# Patient Record
Sex: Male | Born: 1960 | Race: White | Hispanic: No | Marital: Married | Smoking: Former smoker
Health system: Southern US, Community
[De-identification: ages and names within clinical notes are randomized; demographics above are authoritative.]

## PROBLEM LIST (undated history)

## (undated) DIAGNOSIS — E7800 Pure hypercholesterolemia, unspecified: Secondary | ICD-10-CM

## (undated) DIAGNOSIS — L409 Psoriasis, unspecified: Secondary | ICD-10-CM

## (undated) DIAGNOSIS — I10 Essential (primary) hypertension: Secondary | ICD-10-CM

## (undated) DIAGNOSIS — E119 Type 2 diabetes mellitus without complications: Secondary | ICD-10-CM

## (undated) HISTORY — PX: KNEE SURGERY: SHX244

---

## 2014-02-14 ENCOUNTER — Emergency Department (HOSPITAL_COMMUNITY)
Admission: EM | Admit: 2014-02-14 | Discharge: 2014-02-14 | Disposition: A | Discharge status: Home / Self Care | Payer: BC Managed Care – PPO | Attending: Emergency Medicine | Admitting: Emergency Medicine

## 2014-02-14 ENCOUNTER — Emergency Department (HOSPITAL_COMMUNITY): Payer: BC Managed Care – PPO

## 2014-02-14 ENCOUNTER — Encounter (HOSPITAL_COMMUNITY): Payer: Self-pay | Admitting: Emergency Medicine

## 2014-02-14 DIAGNOSIS — E7800 Pure hypercholesterolemia, unspecified: Secondary | ICD-10-CM | POA: Insufficient documentation

## 2014-02-14 DIAGNOSIS — E119 Type 2 diabetes mellitus without complications: Secondary | ICD-10-CM | POA: Insufficient documentation

## 2014-02-14 DIAGNOSIS — N509 Disorder of male genital organs, unspecified: Secondary | ICD-10-CM | POA: Insufficient documentation

## 2014-02-14 DIAGNOSIS — Z87891 Personal history of nicotine dependence: Secondary | ICD-10-CM | POA: Insufficient documentation

## 2014-02-14 DIAGNOSIS — N201 Calculus of ureter: Secondary | ICD-10-CM

## 2014-02-14 DIAGNOSIS — L408 Other psoriasis: Secondary | ICD-10-CM | POA: Insufficient documentation

## 2014-02-14 DIAGNOSIS — Z79899 Other long term (current) drug therapy: Secondary | ICD-10-CM | POA: Insufficient documentation

## 2014-02-14 DIAGNOSIS — I10 Essential (primary) hypertension: Secondary | ICD-10-CM | POA: Insufficient documentation

## 2014-02-14 DIAGNOSIS — Z792 Long term (current) use of antibiotics: Secondary | ICD-10-CM | POA: Insufficient documentation

## 2014-02-14 HISTORY — DX: Pure hypercholesterolemia, unspecified: E78.00

## 2014-02-14 HISTORY — DX: Type 2 diabetes mellitus without complications: E11.9

## 2014-02-14 HISTORY — DX: Psoriasis, unspecified: L40.9

## 2014-02-14 HISTORY — DX: Essential (primary) hypertension: I10

## 2014-02-14 LAB — URINE MICROSCOPIC-ADD ON

## 2014-02-14 LAB — URINALYSIS, ROUTINE W REFLEX MICROSCOPIC
Glucose, UA: NEGATIVE mg/dL
Ketones, ur: 15 mg/dL — AB
Nitrite: NEGATIVE
PROTEIN: 30 mg/dL — AB
Specific Gravity, Urine: 1.021 (ref 1.005–1.030)
UROBILINOGEN UA: 0.2 mg/dL (ref 0.0–1.0)
pH: 5 (ref 5.0–8.0)

## 2014-02-14 MED ORDER — OXYCODONE-ACETAMINOPHEN 5-325 MG PO TABS: 1.0000 | ORAL_TABLET | ORAL | Status: DC | PRN

## 2014-02-14 MED ORDER — CIPROFLOXACIN HCL 500 MG PO TABS
500.0000 mg | ORAL_TABLET | Freq: Once | ORAL | Status: AC
  Administered 2014-02-14: 500 mg via ORAL
  Filled 2014-02-14: qty 1

## 2014-02-14 MED ORDER — KETOROLAC TROMETHAMINE 30 MG/ML IJ SOLN
30.0000 mg | Freq: Once | INTRAMUSCULAR | Status: AC
  Administered 2014-02-14: 30 mg via INTRAVENOUS
  Filled 2014-02-14: qty 1

## 2014-02-14 MED ORDER — ONDANSETRON HCL 4 MG/2ML IJ SOLN
4.0000 mg | Freq: Once | INTRAMUSCULAR | Status: AC
  Administered 2014-02-14: 4 mg via INTRAVENOUS
  Filled 2014-02-14: qty 2

## 2014-02-14 MED ORDER — CIPROFLOXACIN HCL 500 MG PO TABS: 500.0000 mg | ORAL_TABLET | Freq: Two times a day (BID) | ORAL | Status: DC

## 2014-02-14 MED ORDER — SODIUM CHLORIDE 0.9 % IV SOLN
INTRAVENOUS | Status: DC
  Administered 2014-02-14: 10:00:00 via INTRAVENOUS

## 2014-02-14 MED ORDER — ONDANSETRON HCL 4 MG PO TABS: 4.0000 mg | ORAL_TABLET | Freq: Three times a day (TID) | ORAL | Status: DC | PRN

## 2014-02-14 MED ORDER — HYDROMORPHONE HCL PF 1 MG/ML IJ SOLN
0.5000 mg | Freq: Once | INTRAMUSCULAR | Status: AC
  Administered 2014-02-14: 0.5 mg via INTRAVENOUS
  Filled 2014-02-14: qty 1

## 2014-02-14 MED ORDER — HYDROMORPHONE HCL PF 1 MG/ML IJ SOLN
1.0000 mg | Freq: Once | INTRAMUSCULAR | Status: AC
  Administered 2014-02-14: 1 mg via INTRAVENOUS
  Filled 2014-02-14: qty 1

## 2014-02-14 MED ORDER — TAMSULOSIN HCL 0.4 MG PO CAPS
ORAL_CAPSULE | ORAL | Status: DC
Start: 2014-02-14 — End: 2018-08-01

## 2014-02-14 MED ORDER — TAMSULOSIN HCL 0.4 MG PO CAPS
0.4000 mg | ORAL_CAPSULE | Freq: Once | ORAL | Status: AC
  Administered 2014-02-14: 0.4 mg via ORAL
  Filled 2014-02-14: qty 1

## 2014-02-14 NOTE — Discharge Instructions (Signed)
Drink plenty of fluids. Take the medications as prescribed. Call Dr Isabel Caprice, [facility], on Monday April 29th, to get a follow up appointment for your "4 mm proximal ureteral stone". Return to the ED if you get a fever, or have uncontrolled vomiting or diarrhea.    Ureteral Colic (Kidney Stones) Ureteral colic is the result of a condition when kidney stones form inside the kidney. Once kidney stones are formed they may move into the tube that connects the kidney with the bladder (ureter). If this occurs, this condition may cause pain (colic) in the ureter.  CAUSES  Pain is caused by stone movement in the ureter and the obstruction caused by the stone. SYMPTOMS  The pain comes and goes as the ureter contracts around the stone. The pain is usually intense, sharp, and stabbing in character. The location of the pain may move as the stone moves through the ureter. When the stone is near the kidney the pain is usually located in the back and radiates to the belly (abdomen). When the stone is ready to pass into the bladder the pain is often located in the lower abdomen on the side the stone is located. At this location, the symptoms may mimic those of a urinary tract infection with urinary frequency. Once the stone is located here it often passes into the bladder and the pain disappears completely. TREATMENT   Your caregiver will provide you with medicine for pain relief.  You may require specialized follow-up X-rays.  The absence of pain does not always mean that the stone has passed. It may have just stopped moving. If the urine remains completely obstructed, it can cause loss of kidney function or even complete destruction of the involved kidney. It is your responsibility and in your interest that X-rays and follow-ups as suggested by your caregiver are completed. Relief of pain without passage of the stone can be associated with severe damage to the kidney, including loss of kidney function on that  side.  If your stone does not pass on its own, additional measures may be taken by your caregiver to ensure its removal. HOME CARE INSTRUCTIONS   Increase your fluid intake. Water is the preferred fluid since juices containing vitamin C may acidify the urine making it less likely for certain stones (uric acid stones) to pass.  Strain all urine. A strainer will be provided. Keep all particulate matter or stones for your caregiver to inspect.  Take your pain medicine as directed.  Make a follow-up appointment with your caregiver as directed.  Remember that the goal is passage of your stone. The absence of pain does not mean the stone is gone. Follow your caregiver's instructions.  Only take over-the-counter or prescription medicines for pain, discomfort, or fever as directed by your caregiver. SEEK MEDICAL CARE IF:   Pain cannot be controlled with the prescribed medicine.  You have a fever.  Pain continues for longer than your caregiver advises it should.  There is a change in the pain, and you develop chest discomfort or constant abdominal pain.  You feel faint or pass out. MAKE SURE YOU:   Understand these instructions.  Will watch your condition.  Will get help right away if you are not doing well or get worse. Document Released: 05/17/2005 Document Revised: 12/02/2012 Document Reviewed: 02/01/2011 [facility] Patient Information 2015 [place], [place]. This information is not intended to replace advice given to you by your health care provider. Make sure you discuss any questions you have with  your health care provider.  Kidney Stones Kidney stones (urolithiasis) are deposits that form inside your kidneys. The intense pain is caused by the stone moving through the urinary tract. When the stone moves, the ureter goes into spasm around the stone. The stone is usually passed in the urine.  CAUSES   A disorder that makes certain neck glands produce too much parathyroid hormone  (primary hyperparathyroidism).  A buildup of uric acid crystals, similar to gout in your joints.  Narrowing (stricture) of the ureter.  A kidney obstruction present at birth (congenital obstruction).  Previous surgery on the kidney or ureters.  Numerous kidney infections. SYMPTOMS   Feeling sick to your stomach (nauseous).  Throwing up (vomiting).  Blood in the urine (hematuria).  Pain that usually spreads (radiates) to the groin.  Frequency or urgency of urination. DIAGNOSIS   Taking a history and physical exam.  Blood or urine tests.  CT scan.  Occasionally, an examination of the inside of the urinary bladder (cystoscopy) is performed. TREATMENT   Observation.  Increasing your fluid intake.  Extracorporeal shock wave lithotripsy--This is a noninvasive procedure that uses shock waves to break up kidney stones.  Surgery may be needed if you have severe pain or persistent obstruction. There are various surgical procedures. Most of the procedures are performed with the use of small instruments. Only small incisions are needed to accommodate these instruments, so recovery time is minimized. The size, location, and chemical composition are all important variables that will determine the proper choice of action for you. Talk to your health care provider to better understand your situation so that you will minimize the risk of injury to yourself and your kidney.  HOME CARE INSTRUCTIONS   Drink enough water and fluids to keep your urine clear or pale yellow. This will help you to pass the stone or stone fragments.  Strain all urine through the provided strainer. Keep all particulate matter and stones for your health care provider to see. The stone causing the pain may be as small as a grain of salt. It is very important to use the strainer each and every time you pass your urine. The collection of your stone will allow your health care provider to analyze it and verify that a  stone has actually passed. The stone analysis will often identify what you can do to reduce the incidence of recurrences.  Only take over-the-counter or prescription medicines for pain, discomfort, or fever as directed by your health care provider.  Make a follow-up appointment with your health care provider as directed.  Get follow-up X-rays if required. The absence of pain does not always mean that the stone has passed. It may have only stopped moving. If the urine remains completely obstructed, it can cause loss of kidney function or even complete destruction of the kidney. It is your responsibility to make sure X-rays and follow-ups are completed. Ultrasounds of the kidney can show blockages and the status of the kidney. Ultrasounds are not associated with any radiation and can be performed easily in a matter of minutes. SEEK MEDICAL CARE IF:  You experience pain that is progressive and unresponsive to any pain medicine you have been prescribed. SEEK IMMEDIATE MEDICAL CARE IF:   Pain cannot be controlled with the prescribed medicine.  You have a fever or shaking chills.  The severity or intensity of pain increases over 18 hours and is not relieved by pain medicine.  You develop a new onset of abdominal pain.  You feel faint or pass out.  You are unable to urinate. MAKE SURE YOU:   Understand these instructions.  Will watch your condition.  Will get help right away if you are not doing well or get worse. Document Released: 08/07/2005 Document Revised: 04/09/2013 Document Reviewed: 01/08/2013 [facility] Patient Information 2015 [place], [place]. This information is not intended to replace advice given to you by your health care provider. Make sure you discuss any questions you have with your health care provider.  Dietary Guidelines to Help Prevent Kidney Stones Your risk of kidney stones can be decreased by adjusting the foods you eat. The most important thing you can do is drink  enough fluid. You should drink enough fluid to keep your urine clear or pale yellow. The following guidelines provide specific information for the type of kidney stone you have had. GUIDELINES ACCORDING TO TYPE OF KIDNEY STONE Calcium Oxalate Kidney Stones  Reduce the amount of salt you eat. Foods that have a lot of salt cause your body to release excess calcium into your urine. The excess calcium can combine with a substance called oxalate to form kidney stones.  Reduce the amount of animal protein you eat if the amount you eat is excessive. Animal protein causes your body to release excess calcium into your urine. Ask your dietitian how much protein from animal sources you should be eating.  Avoid foods that are high in oxalates. If you take vitamins, they should have less than 500 mg of vitamin C. Your body turns vitamin C into oxalates. You do not need to avoid fruits and vegetables high in vitamin C. Calcium Phosphate Kidney Stones  Reduce the amount of salt you eat to help prevent the release of excess calcium into your urine.  Reduce the amount of animal protein you eat if the amount you eat is excessive. Animal protein causes your body to release excess calcium into your urine. Ask your dietitian how much protein from animal sources you should be eating.  Get enough calcium from food or take a calcium supplement (ask your dietitian for recommendations). Food sources of calcium that do not increase your risk of kidney stones include:  Broccoli.  Dairy products, such as cheese and yogurt.  Pudding. Uric Acid Kidney Stones  Do not have more than 6 oz of animal protein per day. FOOD SOURCES Animal Protein Sources  Meat (all types).  Poultry.  Eggs.  Fish, seafood. Foods High in [employer] seasonings.  Soy sauce.  Teriyaki sauce.  Cured and processed meats.  Salted crackers and snack foods.  Fast food.  Canned soups and most canned foods. Foods High in  Oxalates  Grains:  Amaranth.  Barley.  Grits.  Wheat germ.  Bran.  Buckwheat flour.  All bran cereals.  Pretzels.  Whole wheat bread.  Vegetables:  Beans (wax).  Beets and beet greens.  Collard greens.  Eggplant.  Escarole.  Leeks.  Okra.  Parsley.  Rutabagas.  Spinach.  Swiss chard.  Tomato paste.  Fried potatoes.  Sweet potatoes.  Fruits:  Red currants.  Figs.  Kiwi.  Rhubarb.  Meat and Other Protein Sources:  Beans (dried).  Soy burgers and other soybean products.  Miso.  Nuts (peanuts, almonds, pecans, cashews, hazelnuts).  Nut butters.  Sesame seeds and tahini (paste made of sesame seeds).  Poppy seeds.  Beverages:  Chocolate drink mixes.  Soy milk.  Instant iced tea.  Juices made from high-oxalate fruits or vegetables.  Other:  Carob.  Chocolate.  Fruitcake.  Marmalades. Document Released: 12/02/2010 Document Revised: 08/12/2013 Document Reviewed: 07/04/2013 [facility] Patient Information 2015 [place], [place]. This information is not intended to replace advice given to you by your health care provider. Make sure you discuss any questions you have with your health care provider.

## 2014-02-14 NOTE — ED Provider Notes (Signed)
CSN: [id number]     Arrival date & time 02/14/14  0807 History   First MD Initiated Contact with Patient 02/14/14 [phone]     Chief Complaint  Patient presents with  . Back Pain  . Testicle Pain     (Consider location/radiation/quality/duration/timing/severity/associated sxs/prior Treatment) HPI Patient reports sometime during the night he started having pain in his right flank that radiates into his right testicle. He has had vomiting x2. He feels like he has to urinate but he can't. He states it started when he got up in the middle the night to go to the bathroom. Shortly after urinating the pain started. He states nothing he does makes the pain hurt more, nothing makes the pain feel better. He has tried no medications. He finds it difficult to stand still and his wife reports he keeps moving around. He reports he has never had this before. Patient reports having moderate caffeine intake.  Family history no known history of kidney stones however his brother has had a nephrectomy    PCP None Endocrinologist Dr Altheimer Dermatologist Dr Emily FilbertGould  Past Medical History  Diagnosis Date  . Diabetes mellitus without complication   . Psoriasis   . Hypertension   . High cholesterol    Past Surgical History  Procedure Laterality Date  . Knee surgery     Family History  Problem Relation Age of Onset  . Kidney failure Brother    History  Substance Use Topics  . Smoking status: Former Games developermoker  . Smokeless tobacco: Never Used  . Alcohol Use: No  employed Lives at home Lives with spouse  Review of Systems  All other systems reviewed and are negative.     Allergies  Review of patient's allergies indicates no known allergies.  Home Medications   Prior to Admission medications   Medication Sig Start Date End Date Taking? Authorizing Provider  clobetasol ointment (TEMOVATE) 0.05 % Apply 1 application topically 2 (two) times daily.   Yes Historical Provider, MD  metFORMIN  (GLUMETZA) 1000 MG (MOD) 24 hr tablet Take 1,000 mg by mouth 2 (two) times daily with a meal.   Yes Historical Provider, MD  rosuvastatin (CRESTOR) 10 MG tablet Take 10 mg by mouth daily.   Yes Historical Provider, MD  valsartan (DIOVAN) 160 MG tablet Take 160 mg by mouth daily.   Yes Historical Provider, MD  ciprofloxacin (CIPRO) 500 MG tablet Take 1 tablet (500 mg total) by mouth 2 (two) times daily. 02/14/14   Ward GivensIva L Milley Vining, MD  ondansetron (ZOFRAN) 4 MG tablet Take 1 tablet (4 mg total) by mouth every 8 (eight) hours as needed for nausea or vomiting. 02/14/14   Ward GivensIva L Laneka Mcgrory, MD  oxyCODONE-acetaminophen (PERCOCET/ROXICET) 5-325 MG per tablet Take 1-2 tablets by mouth every 4 (four) hours as needed for severe pain. 02/14/14   Ward GivensIva L Etheridge Geil, MD  tamsulosin (FLOMAX) 0.4 MG CAPS capsule Take 1 po QD until you pass the stone. 02/14/14   Ward GivensIva L Brian Zeitlin, MD   BP 166/86  Pulse 91  Temp(Src) 98.2 F (36.8 C) (Oral)  Resp 16  Ht 6' (1.829 m)  Wt 285 lb (129.275 kg)  BMI 38.64 kg/m2  SpO2 100%  Vital signs normal except for hypertension  Physical Exam  Nursing note and vitals reviewed. Constitutional: He is oriented to person, place, and time. He appears well-developed and well-nourished.  Non-toxic appearance. He does not appear ill. He appears distressed.  HENT:  Head: Normocephalic and atraumatic.  Right Ear:  External ear normal.  Left Ear: External ear normal.  Nose: Nose normal. No mucosal edema or rhinorrhea.  Mouth/Throat: Oropharynx is clear and moist and mucous membranes are normal. No dental abscesses or uvula swelling.  Eyes: Conjunctivae and EOM are normal. Pupils are equal, round, and reactive to light.  Neck: Normal range of motion and full passive range of motion without pain. Neck supple.  Cardiovascular: Normal rate, regular rhythm and normal heart sounds.  Exam reveals no gallop and no friction rub.   No murmur heard. Pulmonary/Chest: Effort normal and breath sounds normal. No  respiratory distress. He has no wheezes. He has no rhonchi. He has no rales. He exhibits no tenderness and no crepitus.  Abdominal: Soft. Normal appearance and bowel sounds are normal. He exhibits no distension. There is no tenderness. There is no rebound and no guarding.  Patient indicates his pain is in his right flank, he also indicates some pain in his right lateral abdomen.  Genitourinary:  Normal external genitalia, right testicle is not enlarged compared to the left and nontender  Musculoskeletal: Normal range of motion. He exhibits no edema and no tenderness.  Moves all extremities well.   Neurological: He is alert and oriented to person, place, and time. He has normal strength. No cranial nerve deficit.  Skin: Skin is warm, dry and intact. Rash noted. No erythema. No pallor.  Has rash c/w psoriasis on his back and scattered on arms   Psychiatric: He has a normal mood and affect. His speech is normal and behavior is normal. His mood appears not anxious.    ED Course  Procedures (including critical care time) Medications  0.9 %  sodium chloride infusion ( Intravenous Stopped 02/14/14 1337)  HYDROmorphone (DILAUDID) injection 1 mg (1 mg Intravenous Given 02/14/14 0930)  ondansetron (ZOFRAN) injection 4 mg (4 mg Intravenous Given 02/14/14 0930)  tamsulosin (FLOMAX) capsule 0.4 mg (0.4 mg Oral Given 02/14/14 1038)  HYDROmorphone (DILAUDID) injection 0.5 mg (0.5 mg Intravenous Given 02/14/14 1106)  ketorolac (TORADOL) 30 MG/ML injection 30 mg (30 mg Intravenous Given 02/14/14 1310)  ciprofloxacin (CIPRO) tablet 500 mg (500 mg Oral Given 02/14/14 1415)     Discussed with patient his symptoms sound like a kidney stone. We'll give IV pain and nausea medicine and get CT of the abdomen. We discussed possible discharge plan.  10:00 results of CT scan discussed with patient and his wife. We discussed discharge plan.  11:00 Pt c/o pain again, given more meds.   11:45 pt sleeping awakened but goes  back asleep. Discussed discharge plan with wife.   13:00 pt feels ready to go home. Discussed discharge plan with patient and wife.   Patient did have some leukocytes in his urine. He was given oral Cipro and started on Cipro to take at home.  Labs Review Results for orders placed during the hospital encounter of 02/14/14  URINALYSIS, ROUTINE W REFLEX MICROSCOPIC      Result Value Ref Range   Color, Urine RED (*) YELLOW   APPearance TURBID (*) CLEAR   Specific Gravity, Urine 1.021  1.005 - 1.030   pH 5.0  5.0 - 8.0   Glucose, UA NEGATIVE  NEGATIVE mg/dL   Hgb urine dipstick LARGE (*) NEGATIVE   Bilirubin Urine SMALL (*) NEGATIVE   Ketones, ur 15 (*) NEGATIVE mg/dL   Protein, ur 30 (*) NEGATIVE mg/dL   Urobilinogen, UA 0.2  0.0 - 1.0 mg/dL   Nitrite NEGATIVE  NEGATIVE   Leukocytes, UA SMALL (*)  NEGATIVE  URINE MICROSCOPIC-ADD ON      Result Value Ref Range   Squamous Epithelial / LPF FEW (*) RARE   WBC, UA 3-6  <3 WBC/hpf   RBC / HPF TOO NUMEROUS TO COUNT  <3 RBC/hpf   Bacteria, UA FEW (*) RARE   Urine-Other MUCOUS PRESENT     Laboratory interpretation all normal except hematuria with a few leukocytes     Imaging Review Ct Abdomen Pelvis Wo Contrast  02/14/2014   CLINICAL DATA:  Right testicular pain, right back pain  EXAM: CT ABDOMEN AND PELVIS WITHOUT CONTRAST  TECHNIQUE: Multidetector CT imaging of the abdomen and pelvis was performed following the standard protocol without IV contrast.  COMPARISON:  None.  FINDINGS: Sagittal images of the spine shows mild degenerative changes thoracolumbar spine. Lung bases are unremarkable. Unenhanced liver shows no biliary ductal dilatation. No calcified gallstones are noted within gallbladder. Unenhanced pancreas, spleen and adrenals are unremarkable. There is mild right hydronephrosis and proximal right hydroureter. No calcified renal calculi. There is a probable cyst in midpole of the left kidney measures 1.5 cm.  Mild right perinephric  stranding.  In axial image 46 there is a calcified obstructive calculus in proximal right ureter measures about 4 mm at the level of lower endplate of L3 vertebral body. Mild atherosclerotic calcifications of distal abdominal aorta. No aortic aneurysm.  No small bowel obstruction. No ascites or free air. No adenopathy. Bilateral distal ureter is unremarkable. Nonspecific mild thickening of urinary bladder wall. Clinical correlation is necessary to exclude mild cystitis. Prostate gland and seminal vesicles are unremarkable.  There is no pericecal inflammation. Stool noted within cecum. Fecal material is noted within terminal ileum probable incompetent ileocecal valve. Normal appendix. Nonspecific mild thickening of rectal wall bowel due to wall collapse/non distension.  IMPRESSION: 1. There is mild right hydronephrosis and proximal right hydroureter. Mild right perinephric stranding. 2. There is 4 mm calcified obstructive calculus in proximal right ureter at the level of lower endplate of L3 vertebral body. 3. No pericecal inflammation. Normal appendix. Stool noted within cecum. 4. Nonspecific mild thickening of urinary bladder wall. Clinical correlation is necessary to exclude cystitis. 5. Question cyst in midpole of the left kidney measures 1.5 cm.   Electronically Signed   By: Natasha Mead M.D.   On: 02/14/2014 09:45     EKG Interpretation None      MDM   Final diagnoses:  Ureterolithiasis     Discharge Medication List as of 02/14/2014  1:06 PM    START taking these medications   Details  ondansetron (ZOFRAN) 4 MG tablet Take 1 tablet (4 mg total) by mouth every 8 (eight) hours as needed for nausea or vomiting., Starting 02/14/2014, Until Discontinued, Print    oxyCODONE-acetaminophen (PERCOCET/ROXICET) 5-325 MG per tablet Take 1-2 tablets by mouth every 4 (four) hours as needed for severe pain., Starting 02/14/2014, Until Discontinued, Print    tamsulosin (FLOMAX) 0.4 MG CAPS capsule Take 1 po  QD until you pass the stone., Print      Cipro 500 mg BID x 7 days  Plan discharge   Devoria Albe, MD, Franz Dell, MD 02/14/14 [phone]

## 2014-02-14 NOTE — ED Notes (Signed)
Patient still unable to urinate  

## 2014-02-14 NOTE — ED Notes (Addendum)
Patient c/o right low back pain and right testicular pain since last night. Patient has vomiting x 2. Patient says he has pain when he urinates.

## 2014-02-14 NOTE — ED Notes (Signed)
MD at bedside. 

## 2014-02-14 NOTE — ED Notes (Signed)
02 placed on patient 2L. [place].

## 2015-10-15 ENCOUNTER — Ambulatory Visit (INDEPENDENT_AMBULATORY_CARE_PROVIDER_SITE_OTHER): Payer: 59 | Admitting: Family Medicine

## 2015-10-15 ENCOUNTER — Ambulatory Visit (INDEPENDENT_AMBULATORY_CARE_PROVIDER_SITE_OTHER): Payer: 59

## 2015-10-15 VITALS — BP 124/82 | HR 96 | Temp 97.9°F | Resp 18 | Ht 71.75 in | Wt 245.4 lb

## 2015-10-15 DIAGNOSIS — E119 Type 2 diabetes mellitus without complications: Secondary | ICD-10-CM

## 2015-10-15 DIAGNOSIS — E785 Hyperlipidemia, unspecified: Secondary | ICD-10-CM | POA: Diagnosis not present

## 2015-10-15 DIAGNOSIS — M25572 Pain in left ankle and joints of left foot: Secondary | ICD-10-CM

## 2015-10-15 NOTE — Progress Notes (Signed)
 @  By signing my name below, I, Raven Small, attest that this documentation has been prepared under the direction and in the presence of Elvina Sidle, MD.  Electronically Signed: Andrew Au, ED Scribe. 10/15/2015. 8:24 AM.  Patient ID: Mitchell Gomez MRN: [id number], DOB: 1961-06-25, 55 y.o. Date of Encounter: 10/15/2015, 8:21 AM  Primary Physician: No primary care provider on file.  Chief Complaint:  Chief Complaint  Patient presents with  . Toe Injury    Dropped a dog crate on little toe on left foot Monday. Possibly broken.    HPI: 55 y.o. year old male with history below presents with left 5th toe crush injury that occurred 4 days ago. Pt states he dropped a dog crate on his 5th left toe. He reports worsening pain with walking. Pt reports hx of type 2 diabetes. He works at [employer].    Past Medical History  Diagnosis Date  . Diabetes mellitus without complication (HCC)   . Psoriasis   . Hypertension   . High cholesterol      Home Meds: Prior to Admission medications   Medication Sig Start Date End Date Taking? Authorizing Provider  metFORMIN (GLUMETZA) 1000 MG (MOD) 24 hr tablet Take 1,000 mg by mouth 2 (two) times daily with a meal.   Yes Historical Provider, MD  rosuvastatin (CRESTOR) 10 MG tablet Take 10 mg by mouth daily.   Yes Historical Provider, MD  valsartan (DIOVAN) 160 MG tablet Take 160 mg by mouth daily.   Yes Historical Provider, MD  ciprofloxacin (CIPRO) 500 MG tablet Take 1 tablet (500 mg total) by mouth 2 (two) times daily. Patient not taking: Reported on 10/15/2015 02/14/14   Devoria Albe, MD  clobetasol ointment (TEMOVATE) 0.05 % Apply 1 application topically 2 (two) times daily. Reported on 10/15/2015    Historical Provider, MD  ondansetron (ZOFRAN) 4 MG tablet Take 1 tablet (4 mg total) by mouth every 8 (eight) hours as needed for nausea or vomiting. Patient not taking: Reported on 10/15/2015 02/14/14   Devoria Albe, MD  oxyCODONE-acetaminophen  (PERCOCET/ROXICET) 5-325 MG per tablet Take 1-2 tablets by mouth every 4 (four) hours as needed for severe pain. Patient not taking: Reported on 10/15/2015 02/14/14   Devoria Albe, MD  tamsulosin (FLOMAX) 0.4 MG CAPS capsule Take 1 po QD until you pass the stone. Patient not taking: Reported on 10/15/2015 02/14/14   Devoria Albe, MD    Allergies: No Known Allergies  Social History   Social History  . Marital Status: Married    Spouse Name: N/A  . Number of Children: N/A  . Years of Education: N/A   Occupational History  . Not on file.   Social History Main Topics  . Smoking status: Former Games developer  . Smokeless tobacco: Never Used  . Alcohol Use: No  . Drug Use: No  . Sexual Activity: Not on file   Other Topics Concern  . Not on file   Social History Narrative    Review of Systems: Constitutional: negative for chills, fever, night sweats, weight changes, or fatigue  HEENT: negative for vision changes, hearing loss, congestion, rhinorrhea, ST, epistaxis, or sinus pressure Cardiovascular: negative for chest pain or palpitations Respiratory: negative for hemoptysis, wheezing, shortness of breath, or cough Abdominal: negative for abdominal pain, nausea, vomiting, diarrhea, or constipation Dermatological: negative for rash Neurologic: negative for headache, dizziness, or syncope All other systems reviewed and are otherwise negative with the exception to those above and in the HPI.  Physical Exam: Blood pressure 124/82, pulse 96, temperature 97.9 F (36.6 C), temperature source Oral, resp. rate 18, height 5' 11.75" (1.822 m), weight 245 lb 6.4 oz (111.313 kg), SpO2 96 %., Body mass index is 33.53 kg/(m^2). General: Well developed, well nourished, in no acute distress. Head: Normocephalic, atraumatic, eyes without discharge, sclera non-icteric, nares are without discharge. Bilateral auditory canals clear, TM's are without perforation, pearly grey and translucent with reflective cone of  light bilaterally. Oral cavity moist.  Neck: Supple. No thyromegaly. Full ROM. No lymphadenopathy. Msk:  Strength and tone normal for age. Left 3 lateral toes are ecchymotic. Dorsal area swollen.  Extremities/Skin: Warm and dry. No clubbing or cyanosis. No edema. No rashes or suspicious lesions. Neuro: Alert and oriented X 3. Moves all extremities spontaneously. Gait is normal. CNII-XII grossly in tact. Psych:  Responds to questions appropriately with a normal affect.   Labs: [facility] reading (PRIMARY) by Dr. Milus Glazier.  Left foot: Non displaced fx of Middle phalanx of middle toe.   ASSESSMENT AND PLAN:  55 y.o. year old male with  This chart was scribed in my presence and reviewed by me personally.    ICD-9-CM ICD-10-CM   1. Pain in joint, ankle and foot, left 719.47 M25.572 DG Foot Complete Left   Postop shoe for 2 weeks.   Signed, Elvina Sidle, MD 10/15/2015 8:21 AM

## 2015-10-15 NOTE — Patient Instructions (Addendum)
Because you received an x-ray today, you will receive an invoice from [facility]. Please contact [facility] at [phone] with questions or concerns regarding your invoice. Our billing staff will not be able to assist you with those questions.   Usually about fracture like you have which is nondisplaced takes about 2 - 3 weeks to heal. I would recommend using the postop shoe which does not band and allows the bones to set better for 2-3 weeks from today.

## 2016-06-09 ENCOUNTER — Encounter: Payer: Self-pay | Admitting: Physician Assistant

## 2016-06-09 DIAGNOSIS — E559 Vitamin D deficiency, unspecified: Secondary | ICD-10-CM | POA: Insufficient documentation

## 2016-06-09 DIAGNOSIS — I10 Essential (primary) hypertension: Secondary | ICD-10-CM | POA: Insufficient documentation

## 2018-07-16 ENCOUNTER — Encounter (HOSPITAL_COMMUNITY): Payer: Self-pay | Admitting: *Deleted

## 2018-07-22 ENCOUNTER — Other Ambulatory Visit: Payer: Self-pay | Admitting: Orthopedic Surgery

## 2018-07-23 ENCOUNTER — Other Ambulatory Visit: Payer: Self-pay | Admitting: Orthopedic Surgery

## 2018-08-05 NOTE — Patient Instructions (Addendum)
Benedetto CoonsDon A Michelsen  08/05/2018   Your procedure is scheduled on: 08-12-18    [facility]    Report to Admitting at 8:15 AM    Call this number if you have problems the morning of surgery [phone]    Remember: Do not eat food or drink liquids :After Midnight.    BRUSH YOUR TEETH MORNING OF SURGERY AND RINSE YOUR MOUTH OUT, NO CHEWING GUM CANDY OR MINTS.     Take these medicines the morning of surgery with A SIP OF WATER: Crestor (Rosuvastatin)     DO NOT TAKE ANY DIABETIC MEDICATIONS DAY OF YOUR SURGERY                               You may not have any metal on your body including hair pins and              piercings  Do not wear jewelry, lotions, powders, cologne or deodorant             Men may shave face and neck.   Do not bring valuables to the hospital. [facility] IS NOT             RESPONSIBLE   FOR VALUABLES.  Contacts, dentures or bridgework may not be worn into surgery.  Leave suitcase in the car. After surgery it may be brought to your room.   Special Instructions: N/A              Please read over the following fact sheets you were given: _____________________________________________________________________ How to Manage Your Diabetes Before and After Surgery  Why is it important to control my blood sugar before and after surgery? . Improving blood sugar levels before and after surgery helps healing and can limit problems. . A way of improving blood sugar control is eating a healthy diet by: o  Eating less sugar and carbohydrates o  Increasing activity/exercise o  Talking with your doctor about reaching your blood sugar goals . High blood sugars (greater than 180 mg/dL) can raise your risk of infections and slow your recovery, so you will need to focus on controlling your diabetes during the weeks before surgery. . Make sure that the doctor who takes care of your diabetes knows about your planned surgery  including the date and location.  How do I manage my blood sugar before surgery? . Check your blood sugar at least 4 times a day, starting 2 days before surgery, to make sure that the level is not too high or low. o Check your blood sugar the morning of your surgery when you wake up and every 2 hours until you get to the Short Stay unit. . If your blood sugar is less than 70 mg/dL, you will need to treat for low blood sugar: o Do not take insulin. o Treat a low blood sugar (less than 70 mg/dL) with  cup of clear juice (cranberry or apple), 4 glucose tablets, OR glucose gel. o Recheck blood sugar in 15 minutes after treatment (to make sure it is greater than 70 mg/dL). If your blood sugar is not greater than 70 mg/dL on recheck, call [phone] for further instructions. . Report your blood sugar to the short stay nurse when you get to Short Stay.  . If you are admitted to the  hospital after surgery: o Your blood sugar will be checked by the staff and you will probably be given insulin after surgery (instead of oral diabetes medicines) to make sure you have good blood sugar levels. o The goal for blood sugar control after surgery is 80-180 mg/dL.   WHAT DO I DO ABOUT MY DIABETES MEDICATION?  Marland Kitchen Do not take oral diabetes medicines (pills) the morning of surgery.  . THE DAY BEFORE SURGERY, take your usual dose of Metformin                    [facility] - Preparing for Surgery Before surgery, you can play an important role.  Because skin is not sterile, your skin needs to be as free of germs as possible.  You can reduce the number of germs on your skin by washing with CHG (chlorahexidine gluconate) soap before surgery.  CHG is an antiseptic cleaner which kills germs and bonds with the skin to continue killing germs even after washing. Please DO NOT use if you have an allergy to CHG or antibacterial soaps.  If your skin becomes reddened/irritated stop using the CHG and inform your nurse when  you arrive at Short Stay. Do not shave (including legs and underarms) for at least 48 hours prior to the first CHG shower.  You may shave your face/neck. Please follow these instructions carefully:  1.  Shower with CHG Soap the night before surgery and the  morning of Surgery.  2.  If you choose to wash your hair, wash your hair first as usual with your  normal  shampoo.  3.  After you shampoo, rinse your hair and body thoroughly to remove the  shampoo.                           4.  Use CHG as you would any other liquid soap.  You can apply chg directly  to the skin and wash                       Gently with a scrungie or clean washcloth.  5.  Apply the CHG Soap to your body ONLY FROM THE NECK DOWN.   Do not use on face/ open                           Wound or open sores. Avoid contact with eyes, ears mouth and genitals (private parts).                       Wash face,  Genitals (private parts) with your normal soap.             6.  Wash thoroughly, paying special attention to the area where your surgery  will be performed.  7.  Thoroughly rinse your body with warm water from the neck down.  8.  DO NOT shower/wash with your normal soap after using and rinsing off  the CHG Soap.                9.  Pat yourself dry with a clean towel.            10.  Wear clean pajamas.            11.  Place clean sheets on your bed the night of your first shower and do not  sleep with  pets. Day of Surgery : Do not apply any lotions/deodorants the morning of surgery.  Please wear clean clothes to the hospital/surgery center.  FAILURE TO FOLLOW THESE INSTRUCTIONS MAY RESULT IN THE CANCELLATION OF YOUR SURGERY PATIENT SIGNATURE_________________________________  NURSE SIGNATURE__________________________________  ________________________________________________________________________   Rogelia Mire  An incentive spirometer is a tool that can help keep your lungs clear and active. This tool measures  how well you are filling your lungs with each breath. Taking long deep breaths may help reverse or decrease the chance of developing breathing (pulmonary) problems (especially infection) following:  A long period of time when you are unable to move or be active. BEFORE THE PROCEDURE   If the spirometer includes an indicator to show your best effort, your nurse or respiratory therapist will set it to a desired goal.  If possible, sit up straight or lean slightly forward. Try not to slouch.  Hold the incentive spirometer in an upright position. INSTRUCTIONS FOR USE  1. Sit on the edge of your bed if possible, or sit up as far as you can in bed or on a chair. 2. Hold the incentive spirometer in an upright position. 3. Breathe out normally. 4. Place the mouthpiece in your mouth and seal your lips tightly around it. 5. Breathe in slowly and as deeply as possible, raising the piston or the ball toward the top of the column. 6. Hold your breath for 3-5 seconds or for as long as possible. Allow the piston or ball to fall to the bottom of the column. 7. Remove the mouthpiece from your mouth and breathe out normally. 8. Rest for a few seconds and repeat Steps 1 through 7 at least 10 times every 1-2 hours when you are awake. Take your time and take a few normal breaths between deep breaths. 9. The spirometer may include an indicator to show your best effort. Use the indicator as a goal to work toward during each repetition. 10. After each set of 10 deep breaths, practice coughing to be sure your lungs are clear. If you have an incision (the cut made at the time of surgery), support your incision when coughing by placing a pillow or rolled up towels firmly against it. Once you are able to get out of bed, walk around indoors and cough well. You may stop using the incentive spirometer when instructed by your caregiver.  RISKS AND COMPLICATIONS  Take your time so you do not get dizzy or light-headed.  If  you are in pain, you may need to take or ask for pain medication before doing incentive spirometry. It is harder to take a deep breath if you are having pain. AFTER USE  Rest and breathe slowly and easily.  It can be helpful to keep track of a log of your progress. Your caregiver can provide you with a simple table to help with this. If you are using the spirometer at home, follow these instructions: SEEK MEDICAL CARE IF:   You are having difficultly using the spirometer.  You have trouble using the spirometer as often as instructed.  Your pain medication is not giving enough relief while using the spirometer.  You develop fever of 100.5 F (38.1 C) or higher. SEEK IMMEDIATE MEDICAL CARE IF:   You cough up bloody sputum that had not been present before.  You develop fever of 102 F (38.9 C) or greater.  You develop worsening pain at or near the incision site. MAKE SURE YOU:   Understand these  instructions.  Will watch your condition.  Will get help right away if you are not doing well or get worse. Document Released: 12/18/2006 Document Revised: 10/30/2011 Document Reviewed: 02/18/2007 Exit[employer] Patient Information 2014 ExitCare, [place].   ________________________________________________________________________  WHAT IS A BLOOD TRANSFUSION? Blood Transfusion Information  A transfusion is the replacement of blood or some of its parts. Blood is made up of multiple cells which provide different functions.  Red blood cells carry oxygen and are used for blood loss replacement.  White blood cells fight against infection.  Platelets control bleeding.  Plasma helps clot blood.  Other blood products are available for specialized needs, such as hemophilia or other clotting disorders. BEFORE THE TRANSFUSION  Who gives blood for transfusions?   Healthy volunteers who are fully evaluated to make sure their blood is safe. This is blood bank blood. Transfusion therapy is the  safest it has ever been in the practice of medicine. Before blood is taken from a donor, a complete history is taken to make sure that person has no history of diseases nor engages in risky social behavior (examples are intravenous drug use or sexual activity with multiple partners). The donor's travel history is screened to minimize risk of transmitting infections, such as malaria. The donated blood is tested for signs of infectious diseases, such as HIV and hepatitis. The blood is then tested to be sure it is compatible with you in order to minimize the chance of a transfusion reaction. If you or a relative donates blood, this is often done in anticipation of surgery and is not appropriate for emergency situations. It takes many days to process the donated blood. RISKS AND COMPLICATIONS Although transfusion therapy is very safe and saves many lives, the main dangers of transfusion include:   Getting an infectious disease.  Developing a transfusion reaction. This is an allergic reaction to something in the blood you were given. Every precaution is taken to prevent this. The decision to have a blood transfusion has been considered carefully by your caregiver before blood is given. Blood is not given unless the benefits outweigh the risks. AFTER THE TRANSFUSION  Right after receiving a blood transfusion, you will usually feel much better and more energetic. This is especially true if your red blood cells have gotten low (anemic). The transfusion raises the level of the red blood cells which carry oxygen, and this usually causes an energy increase.  The nurse administering the transfusion will monitor you carefully for complications. HOME CARE INSTRUCTIONS  No special instructions are needed after a transfusion. You may find your energy is better. Speak with your caregiver about any limitations on activity for underlying diseases you may have. SEEK MEDICAL CARE IF:   Your condition is not improving  after your transfusion.  You develop redness or irritation at the intravenous (IV) site. SEEK IMMEDIATE MEDICAL CARE IF:  Any of the following symptoms occur over the next 12 hours:  Shaking chills.  You have a temperature by mouth above 102 F (38.9 C), not controlled by medicine.  Chest, back, or muscle pain.  People around you feel you are not acting correctly or are confused.  Shortness of breath or difficulty breathing.  Dizziness and fainting.  You get a rash or develop hives.  You have a decrease in urine output.  Your urine turns a dark color or changes to pink, red, or brown. Any of the following symptoms occur over the next 10 days:  You have a temperature by mouth  above 102 F (38.9 C), not controlled by medicine.  Shortness of breath.  Weakness after normal activity.  The white part of the eye turns yellow (jaundice).  You have a decrease in the amount of urine or are urinating less often.  Your urine turns a dark color or changes to pink, red, or brown. Document Released: 08/04/2000 Document Revised: 10/30/2011 Document Reviewed: 03/23/2008 [facility] Patient Information 2014 [place], [place].  _______________________________________________________________________

## 2018-08-08 ENCOUNTER — Encounter (HOSPITAL_COMMUNITY)
Admission: RE | Admit: 2018-08-08 | Discharge: 2018-08-08 | Disposition: A | Discharge status: Home / Self Care | Payer: 59 | Source: Ambulatory Visit | Attending: Orthopedic Surgery | Admitting: Orthopedic Surgery

## 2018-08-08 ENCOUNTER — Other Ambulatory Visit: Payer: Self-pay

## 2018-08-08 ENCOUNTER — Ambulatory Visit (HOSPITAL_COMMUNITY)
Admission: RE | Admit: 2018-08-08 | Discharge: 2018-08-08 | Disposition: A | Discharge status: Home / Self Care | Payer: 59 | Source: Ambulatory Visit | Attending: Orthopedic Surgery | Admitting: Orthopedic Surgery

## 2018-08-08 ENCOUNTER — Encounter (HOSPITAL_COMMUNITY): Payer: Self-pay

## 2018-08-08 DIAGNOSIS — Z01818 Encounter for other preprocedural examination: Secondary | ICD-10-CM | POA: Insufficient documentation

## 2018-08-08 LAB — CBC WITH DIFFERENTIAL/PLATELET
ABS IMMATURE GRANULOCYTES: 0.02 10*3/uL (ref 0.00–0.07)
Basophils Absolute: 0.1 10*3/uL (ref 0.0–0.1)
Basophils Relative: 1 %
Eosinophils Absolute: 0.1 10*3/uL (ref 0.0–0.5)
Eosinophils Relative: 2 %
HCT: 47.8 % (ref 39.0–52.0)
Hemoglobin: 15.3 g/dL (ref 13.0–17.0)
Immature Granulocytes: 0 %
Lymphocytes Relative: 24 %
Lymphs Abs: 1.6 10*3/uL (ref 0.7–4.0)
MCH: 27.8 pg (ref 26.0–34.0)
MCHC: 32 g/dL (ref 30.0–36.0)
MCV: 86.9 fL (ref 80.0–100.0)
MONO ABS: 0.6 10*3/uL (ref 0.1–1.0)
Monocytes Relative: 8 %
NEUTROS ABS: 4.4 10*3/uL (ref 1.7–7.7)
Neutrophils Relative %: 65 %
PLATELETS: 281 10*3/uL (ref 150–400)
RBC: 5.5 MIL/uL (ref 4.22–5.81)
RDW: 12.4 % (ref 11.5–15.5)
WBC: 6.8 10*3/uL (ref 4.0–10.5)
nRBC: 0 % (ref 0.0–0.2)

## 2018-08-08 LAB — URINALYSIS, ROUTINE W REFLEX MICROSCOPIC
Bilirubin Urine: NEGATIVE
GLUCOSE, UA: NEGATIVE mg/dL
Hgb urine dipstick: NEGATIVE
KETONES UR: NEGATIVE mg/dL
Leukocytes, UA: NEGATIVE
Nitrite: NEGATIVE
PH: 5 (ref 5.0–8.0)
Protein, ur: NEGATIVE mg/dL
Specific Gravity, Urine: 1.028 (ref 1.005–1.030)

## 2018-08-08 LAB — BASIC METABOLIC PANEL
Anion gap: 9 (ref 5–15)
BUN: 17 mg/dL (ref 6–20)
CO2: 28 mmol/L (ref 22–32)
Calcium: 9.3 mg/dL (ref 8.9–10.3)
Chloride: 104 mmol/L (ref 98–111)
Creatinine, Ser: 0.78 mg/dL (ref 0.61–1.24)
GFR calc Af Amer: >60 mL/min (ref >60)
GFR calc non Af Amer: >60 mL/min (ref >60)
Glucose, Bld: 145 mg/dL — ABNORMAL HIGH (ref 70–99)
Potassium: 4.5 mmol/L (ref 3.5–5.1)
Sodium: 141 mmol/L (ref 135–145)

## 2018-08-08 LAB — GLUCOSE, CAPILLARY: Glucose-Capillary: 123 mg/dL — ABNORMAL HIGH (ref 70–99)

## 2018-08-08 LAB — ABO/RH: ABO/RH(D): B POS

## 2018-08-08 LAB — SURGICAL PCR SCREEN
MRSA, PCR: NEGATIVE
Staphylococcus aureus: NEGATIVE

## 2018-08-08 LAB — HEMOGLOBIN A1C
HEMOGLOBIN A1C: 6.1 % — AB (ref 4.8–5.6)
Mean Plasma Glucose: 128.37 mg/dL

## 2018-08-08 LAB — PROTIME-INR
INR: 0.97
Prothrombin Time: 12.8 seconds (ref 11.4–15.2)

## 2018-08-08 LAB — APTT: aPTT: 29 seconds (ref 24–36)

## 2018-08-09 DIAGNOSIS — M1711 Unilateral primary osteoarthritis, right knee: Secondary | ICD-10-CM | POA: Diagnosis present

## 2018-08-09 NOTE — H&P (Signed)
TOTAL KNEE ADMISSION H&P  Patient is being admitted for right total knee arthroplasty.  Subjective:  Chief Complaint:right knee pain.  HPI: Mitchell Gomez, 57 y.o. male, has a history of pain and functional disability in the right knee due to arthritis and has failed non-surgical conservative treatments for greater than 12 weeks to includeNSAID's and/or analgesics, corticosteriod injections, use of assistive devices, weight reduction as appropriate and activity modification.  Onset of symptoms was gradual, starting >10 years ago with gradually worsening course since that time. The patient noted prior procedures on the knee to include  arthroscopy on the right knee(s).  Patient currently rates pain in the right knee(s) at 10 out of 10 with activity. Patient has night pain, worsening of pain with activity and weight bearing, pain that interferes with activities of daily living, pain with passive range of motion, crepitus and joint swelling.  Patient has evidence of periarticular osteophytes and joint space narrowing by imaging studies.   There is no active infection.  Patient Active Problem List   Diagnosis Date Noted  . Osteoarthritis of right knee 08/09/2018  . Essential hypertension 06/09/2016  . Vitamin D deficiency 06/09/2016  . Type 2 diabetes mellitus treated without insulin (HCC) 10/15/2015  . Hyperlipidemia 10/15/2015   Past Medical History:  Diagnosis Date  . Diabetes mellitus without complication (HCC)   . High cholesterol   . Hypertension   . Psoriasis     Past Surgical History:  Procedure Laterality Date  . KNEE SURGERY      No current facility-administered medications for this encounter.    Current Outpatient Medications  Medication Sig Dispense Refill Last Dose  . fluocinonide (LIDEX) 0.05 % external solution Apply 1 application topically daily as needed (irritation).     . Guselkumab (TREMFYA) 100 MG/ML SOPN Inject 100 mg into the skin every 8 (eight) weeks.     .  meloxicam (MOBIC) 15 MG tablet Take 15 mg by mouth daily.     . metFORMIN (GLUCOPHAGE-XR) 500 MG 24 hr tablet Take 1,000 mg by mouth 2 (two) times daily.     Marland Kitchen. olmesartan (BENICAR) 20 MG tablet Take 20 mg by mouth daily.     . rosuvastatin (CRESTOR) 10 MG tablet Take 10 mg by mouth daily.   Taking   No Known Allergies  Social History   Tobacco Use  . Smoking status: Former Smoker    Packs/day: 0.25    Years: 10.00    Pack years: 2.50    Types: Cigarettes    Last attempt to quit: 2006    Years since quitting: 13.9  . Smokeless tobacco: Never Used  Substance Use Topics  . Alcohol use: No    Family History  Problem Relation Age of Onset  . Kidney failure Brother      Review of Systems  Constitutional: Negative.   HENT: Negative.   Eyes: Negative.   Respiratory: Negative.   Cardiovascular: Negative.   Gastrointestinal: Negative.   Genitourinary: Negative.   Musculoskeletal: Positive for joint pain.  Skin: Negative.   Neurological: Negative.   Endo/Heme/Allergies:       Diabetes  Psychiatric/Behavioral: Negative.     Objective:  Physical Exam  Constitutional: He is oriented to person, place, and time. He appears well-developed and well-nourished.  HENT:  Head: Normocephalic and atraumatic.  Eyes: Pupils are equal, round, and reactive to light.  Neck: Normal range of motion. Neck supple.  Cardiovascular: Intact distal pulses.  Musculoskeletal:  General: Tenderness and deformity present.     Comments: Right knee has not obvious 10 valgus deformity, range of motion 0/120, tender along the lateral joint line, 1+ effusion grinding crepitus as you taken through range of motion.  Valgus stress exacerbates his pain.  Varus stress diminish as his pain.  Neurovascular intact distally.  The contralateral left knee has a mild varus deformity.  Neurological: He is alert and oriented to person, place, and time.  Skin: Skin is warm and dry.  Psychiatric: He has a normal  mood and affect. His behavior is normal. Judgment and thought content normal.    Vital signs in last 24 hours:    Labs:   Estimated body mass index is 33.91 kg/m as calculated from the following:   Height as of 08/08/18: 6' (1.829 m).   Weight as of 08/08/18: 113.4 kg.   Imaging Review Plain radiographs demonstrate AP, Rosenberg, lateral and sunrise x-rays show erosive end-stage arthritis of the lateral compartment of the right knee medial joint line appears to be in good shape.  There is chondrocalcinosis to both knees.  There is no elongation of the medial collateral ligament.    Preoperative templating of the joint replacement has been completed, documented, and submitted to the Operating Room personnel in order to optimize intra-operative equipment management.   Anticipated LOS equal to or greater than 2 midnights due to - Age 58 and older with one or more of the following:  - Obesity  - Expected need for hospital services (PT, OT, Nursing) required for safe  discharge  - Anticipated need for postoperative skilled nursing care or inpatient rehab  - Active co-morbidities: Diabetes   Assessment/Plan:  End stage arthritis, right knee   The patient history, physical examination, clinical judgment of the provider and imaging studies are consistent with end stage degenerative joint disease of the right knee(s) and total knee arthroplasty is deemed medically necessary. The treatment options including medical management, injection therapy arthroscopy and arthroplasty were discussed at length. The risks and benefits of total knee arthroplasty were presented and reviewed. The risks due to aseptic loosening, infection, stiffness, patella tracking problems, thromboembolic complications and other imponderables were discussed. The patient acknowledged the explanation, agreed to proceed with the plan and consent was signed. Patient is being admitted for inpatient treatment for surgery, pain  control, PT, OT, prophylactic antibiotics, VTE prophylaxis, progressive ambulation and ADL's and discharge planning. The patient is planning to be discharged home with home health services.

## 2018-08-11 MED ORDER — BUPIVACAINE LIPOSOME 1.3 % IJ SUSP
20.0000 mL | INTRAMUSCULAR | Status: DC
  Filled 2018-08-11: qty 20

## 2018-08-11 MED ORDER — TRANEXAMIC ACID 1000 MG/10ML IV SOLN
2000.0000 mg | INTRAVENOUS | Status: DC
  Filled 2018-08-11: qty 20

## 2018-08-12 ENCOUNTER — Other Ambulatory Visit: Payer: Self-pay

## 2018-08-12 ENCOUNTER — Encounter (HOSPITAL_COMMUNITY)
Admission: RE | Disposition: A | Discharge status: Home / Self Care | Payer: Self-pay | Source: Home / Self Care | Attending: Orthopedic Surgery

## 2018-08-12 ENCOUNTER — Encounter (HOSPITAL_COMMUNITY): Payer: Self-pay

## 2018-08-12 ENCOUNTER — Ambulatory Visit (HOSPITAL_COMMUNITY)
Admission: RE | Admit: 2018-08-12 | Discharge: 2018-08-13 | Disposition: A | Discharge status: Home / Self Care | Payer: 59 | Attending: Orthopedic Surgery | Admitting: Orthopedic Surgery

## 2018-08-12 ENCOUNTER — Ambulatory Visit (HOSPITAL_COMMUNITY): Payer: 59 | Admitting: Certified Registered Nurse Anesthetist

## 2018-08-12 DIAGNOSIS — M1711 Unilateral primary osteoarthritis, right knee: Secondary | ICD-10-CM | POA: Insufficient documentation

## 2018-08-12 DIAGNOSIS — E669 Obesity, unspecified: Secondary | ICD-10-CM | POA: Insufficient documentation

## 2018-08-12 DIAGNOSIS — I10 Essential (primary) hypertension: Secondary | ICD-10-CM | POA: Insufficient documentation

## 2018-08-12 DIAGNOSIS — E7800 Pure hypercholesterolemia, unspecified: Secondary | ICD-10-CM | POA: Insufficient documentation

## 2018-08-12 DIAGNOSIS — Z96651 Presence of right artificial knee joint: Secondary | ICD-10-CM

## 2018-08-12 DIAGNOSIS — L409 Psoriasis, unspecified: Secondary | ICD-10-CM | POA: Insufficient documentation

## 2018-08-12 DIAGNOSIS — Z87891 Personal history of nicotine dependence: Secondary | ICD-10-CM | POA: Diagnosis not present

## 2018-08-12 DIAGNOSIS — Z79899 Other long term (current) drug therapy: Secondary | ICD-10-CM | POA: Diagnosis not present

## 2018-08-12 DIAGNOSIS — E119 Type 2 diabetes mellitus without complications: Secondary | ICD-10-CM | POA: Diagnosis not present

## 2018-08-12 DIAGNOSIS — Z791 Long term (current) use of non-steroidal anti-inflammatories (NSAID): Secondary | ICD-10-CM | POA: Diagnosis not present

## 2018-08-12 DIAGNOSIS — Z7984 Long term (current) use of oral hypoglycemic drugs: Secondary | ICD-10-CM | POA: Diagnosis not present

## 2018-08-12 DIAGNOSIS — Z6833 Body mass index (BMI) 33.0-33.9, adult: Secondary | ICD-10-CM | POA: Insufficient documentation

## 2018-08-12 DIAGNOSIS — E785 Hyperlipidemia, unspecified: Secondary | ICD-10-CM | POA: Insufficient documentation

## 2018-08-12 DIAGNOSIS — Z7982 Long term (current) use of aspirin: Secondary | ICD-10-CM | POA: Insufficient documentation

## 2018-08-12 HISTORY — PX: TOTAL KNEE ARTHROPLASTY: SHX125

## 2018-08-12 LAB — TYPE AND SCREEN
ABO/RH(D): B POS
Antibody Screen: NEGATIVE

## 2018-08-12 LAB — GLUCOSE, CAPILLARY
Glucose-Capillary: 139 mg/dL — ABNORMAL HIGH (ref 70–99)
Glucose-Capillary: 101 mg/dL — ABNORMAL HIGH (ref 70–99)
Glucose-Capillary: 120 mg/dL — ABNORMAL HIGH (ref 70–99)
Glucose-Capillary: 177 mg/dL — ABNORMAL HIGH (ref 70–99)

## 2018-08-12 LAB — HEMOGLOBIN A1C
Hgb A1c MFr Bld: 6.1 % — ABNORMAL HIGH (ref 4.8–5.6)
Mean Plasma Glucose: 128.37 mg/dL

## 2018-08-12 SURGERY — ARTHROPLASTY, KNEE, TOTAL
Anesthesia: Regional | Laterality: Right

## 2018-08-12 MED ORDER — FENTANYL CITRATE (PF) 100 MCG/2ML IJ SOLN
INTRAMUSCULAR | Status: AC
  Administered 2018-08-12: 50 ug via INTRAVENOUS
  Filled 2018-08-12: qty 2

## 2018-08-12 MED ORDER — TRANEXAMIC ACID 1000 MG/10ML IV SOLN
INTRAVENOUS | Status: DC | PRN
  Administered 2018-08-12: 2000 mg via TOPICAL

## 2018-08-12 MED ORDER — SODIUM CHLORIDE (PF) 0.9 % IJ SOLN
INTRAMUSCULAR | Status: AC
  Filled 2018-08-12: qty 20

## 2018-08-12 MED ORDER — PROPOFOL 10 MG/ML IV BOLUS
INTRAVENOUS | Status: AC
  Filled 2018-08-12: qty 20

## 2018-08-12 MED ORDER — GABAPENTIN 300 MG PO CAPS
300.0000 mg | ORAL_CAPSULE | Freq: Three times a day (TID) | ORAL | Status: DC
  Administered 2018-08-12 – 2018-08-13 (×3): 300 mg via ORAL
  Filled 2018-08-12 (×3): qty 1

## 2018-08-12 MED ORDER — OXYCODONE HCL 5 MG PO TABS
5.0000 mg | ORAL_TABLET | ORAL | Status: DC | PRN
  Administered 2018-08-12 – 2018-08-13 (×2): 5 mg via ORAL
  Administered 2018-08-13 (×4): 10 mg via ORAL
  Filled 2018-08-12 (×2): qty 2
  Filled 2018-08-12: qty 1
  Filled 2018-08-12 (×2): qty 2
  Filled 2018-08-12: qty 1

## 2018-08-12 MED ORDER — FENTANYL CITRATE (PF) 100 MCG/2ML IJ SOLN
50.0000 ug | INTRAMUSCULAR | Status: DC
  Administered 2018-08-12: 50 ug via INTRAVENOUS
  Filled 2018-08-12: qty 2

## 2018-08-12 MED ORDER — CEFAZOLIN SODIUM-DEXTROSE 2-4 GM/100ML-% IV SOLN
2.0000 g | INTRAVENOUS | Status: AC
  Administered 2018-08-12: 2 g via INTRAVENOUS
  Filled 2018-08-12: qty 100

## 2018-08-12 MED ORDER — ONDANSETRON HCL 4 MG PO TABS: 4.0000 mg | ORAL_TABLET | Freq: Four times a day (QID) | ORAL | Status: DC | PRN

## 2018-08-12 MED ORDER — PANTOPRAZOLE SODIUM 40 MG PO TBEC
40.0000 mg | DELAYED_RELEASE_TABLET | Freq: Every day | ORAL | Status: DC
  Administered 2018-08-13: 40 mg via ORAL
  Filled 2018-08-12: qty 1

## 2018-08-12 MED ORDER — 0.9 % SODIUM CHLORIDE (POUR BTL) OPTIME
TOPICAL | Status: DC | PRN
  Administered 2018-08-12: 1000 mL

## 2018-08-12 MED ORDER — PROPOFOL 10 MG/ML IV BOLUS
INTRAVENOUS | Status: AC
  Filled 2018-08-12: qty 80

## 2018-08-12 MED ORDER — BUPIVACAINE-EPINEPHRINE (PF) 0.25% -1:200000 IJ SOLN
INTRAMUSCULAR | Status: AC
  Filled 2018-08-12: qty 30

## 2018-08-12 MED ORDER — BUPIVACAINE LIPOSOME 1.3 % IJ SUSP
INTRAMUSCULAR | Status: DC | PRN
  Administered 2018-08-12: 20 mL

## 2018-08-12 MED ORDER — HYDROMORPHONE HCL 1 MG/ML IJ SOLN: 0.5000 mg | INTRAMUSCULAR | Status: DC | PRN

## 2018-08-12 MED ORDER — IRBESARTAN 150 MG PO TABS
150.0000 mg | ORAL_TABLET | Freq: Every day | ORAL | Status: DC
  Administered 2018-08-12 – 2018-08-13 (×2): 150 mg via ORAL
  Filled 2018-08-12 (×2): qty 1

## 2018-08-12 MED ORDER — ONDANSETRON HCL 4 MG/2ML IJ SOLN
INTRAMUSCULAR | Status: AC
  Filled 2018-08-12: qty 2

## 2018-08-12 MED ORDER — FLEET ENEMA 7-19 GM/118ML RE ENEM: 1.0000 | ENEMA | Freq: Once | RECTAL | Status: DC | PRN

## 2018-08-12 MED ORDER — DIPHENHYDRAMINE HCL 12.5 MG/5ML PO ELIX: 12.5000 mg | ORAL_SOLUTION | ORAL | Status: DC | PRN

## 2018-08-12 MED ORDER — LACTATED RINGERS IV SOLN
INTRAVENOUS | Status: DC
  Administered 2018-08-12: 1000 mL via INTRAVENOUS
  Administered 2018-08-12: 13:00:00 via INTRAVENOUS

## 2018-08-12 MED ORDER — SODIUM CHLORIDE (PF) 0.9 % IJ SOLN
INTRAMUSCULAR | Status: DC | PRN
  Administered 2018-08-12: 70 mL

## 2018-08-12 MED ORDER — FLUOCINONIDE 0.05 % EX SOLN: 1.0000 "application " | Freq: Every day | CUTANEOUS | Status: DC | PRN

## 2018-08-12 MED ORDER — PROPOFOL 500 MG/50ML IV EMUL
INTRAVENOUS | Status: DC | PRN
  Administered 2018-08-12: 75 ug/kg/min via INTRAVENOUS

## 2018-08-12 MED ORDER — MIDAZOLAM HCL 2 MG/2ML IJ SOLN
0.5000 mg | INTRAMUSCULAR | Status: DC
  Administered 2018-08-12: 2 mg via INTRAVENOUS
  Filled 2018-08-12: qty 2

## 2018-08-12 MED ORDER — BUPIVACAINE-EPINEPHRINE (PF) 0.25% -1:200000 IJ SOLN
INTRAMUSCULAR | Status: DC | PRN
  Administered 2018-08-12: 30 mL

## 2018-08-12 MED ORDER — MENTHOL 3 MG MT LOZG: 1.0000 | LOZENGE | OROMUCOSAL | Status: DC | PRN

## 2018-08-12 MED ORDER — BUPIVACAINE-EPINEPHRINE (PF) 0.5% -1:200000 IJ SOLN
INTRAMUSCULAR | Status: DC | PRN
  Administered 2018-08-12: 20 mL via PERINEURAL

## 2018-08-12 MED ORDER — BISACODYL 5 MG PO TBEC: 5.0000 mg | DELAYED_RELEASE_TABLET | Freq: Every day | ORAL | Status: DC | PRN

## 2018-08-12 MED ORDER — ONDANSETRON HCL 4 MG/2ML IJ SOLN: 4.0000 mg | Freq: Four times a day (QID) | INTRAMUSCULAR | Status: DC | PRN

## 2018-08-12 MED ORDER — METFORMIN HCL ER 500 MG PO TB24
1000.0000 mg | ORAL_TABLET | Freq: Two times a day (BID) | ORAL | Status: DC
  Administered 2018-08-13: 1000 mg via ORAL
  Filled 2018-08-12: qty 2

## 2018-08-12 MED ORDER — METHOCARBAMOL 500 MG PO TABS
500.0000 mg | ORAL_TABLET | Freq: Four times a day (QID) | ORAL | Status: DC | PRN
  Administered 2018-08-12 – 2018-08-13 (×3): 500 mg via ORAL
  Filled 2018-08-12 (×3): qty 1

## 2018-08-12 MED ORDER — PHENOL 1.4 % MT LIQD: 1.0000 | OROMUCOSAL | Status: DC | PRN

## 2018-08-12 MED ORDER — FENTANYL CITRATE (PF) 100 MCG/2ML IJ SOLN
25.0000 ug | INTRAMUSCULAR | Status: DC | PRN
  Administered 2018-08-12: 50 ug via INTRAVENOUS

## 2018-08-12 MED ORDER — CELECOXIB 200 MG PO CAPS
200.0000 mg | ORAL_CAPSULE | Freq: Two times a day (BID) | ORAL | Status: DC
  Administered 2018-08-12 – 2018-08-13 (×2): 200 mg via ORAL
  Filled 2018-08-12 (×2): qty 1

## 2018-08-12 MED ORDER — METHOCARBAMOL 500 MG IVPB - SIMPLE MED
INTRAVENOUS | Status: AC
  Administered 2018-08-12: 500 mg via INTRAVENOUS
  Filled 2018-08-12: qty 50

## 2018-08-12 MED ORDER — ASPIRIN EC 81 MG PO TBEC: 81.0000 mg | DELAYED_RELEASE_TABLET | Freq: Two times a day (BID) | ORAL | 0 refills | Status: AC

## 2018-08-12 MED ORDER — INSULIN ASPART 100 UNIT/ML ~~LOC~~ SOLN
0.0000 [IU] | Freq: Three times a day (TID) | SUBCUTANEOUS | Status: DC
  Administered 2018-08-13 (×2): 3 [IU] via SUBCUTANEOUS

## 2018-08-12 MED ORDER — LIDOCAINE 2% (20 MG/ML) 5 ML SYRINGE
INTRAMUSCULAR | Status: AC
  Filled 2018-08-12: qty 5

## 2018-08-12 MED ORDER — POLYETHYLENE GLYCOL 3350 17 G PO PACK: 17.0000 g | PACK | Freq: Every day | ORAL | Status: DC | PRN

## 2018-08-12 MED ORDER — DOCUSATE SODIUM 100 MG PO CAPS
100.0000 mg | ORAL_CAPSULE | Freq: Two times a day (BID) | ORAL | Status: DC
  Administered 2018-08-12 – 2018-08-13 (×2): 100 mg via ORAL
  Filled 2018-08-12 (×2): qty 1

## 2018-08-12 MED ORDER — KCL IN DEXTROSE-NACL 20-5-0.45 MEQ/L-%-% IV SOLN
INTRAVENOUS | Status: DC
  Administered 2018-08-12 – 2018-08-13 (×2): via INTRAVENOUS
  Filled 2018-08-12 (×3): qty 1000

## 2018-08-12 MED ORDER — PROPOFOL 10 MG/ML IV BOLUS
INTRAVENOUS | Status: DC | PRN
  Administered 2018-08-12 (×2): 30 mg via INTRAVENOUS
  Administered 2018-08-12: 20 mg via INTRAVENOUS

## 2018-08-12 MED ORDER — ONDANSETRON HCL 4 MG/2ML IJ SOLN
INTRAMUSCULAR | Status: DC | PRN
  Administered 2018-08-12: 4 mg via INTRAVENOUS

## 2018-08-12 MED ORDER — METHOCARBAMOL 500 MG IVPB - SIMPLE MED
500.0000 mg | Freq: Four times a day (QID) | INTRAVENOUS | Status: DC | PRN
  Administered 2018-08-12: 500 mg via INTRAVENOUS
  Filled 2018-08-12: qty 50

## 2018-08-12 MED ORDER — TRANEXAMIC ACID-NACL 1000-0.7 MG/100ML-% IV SOLN
1000.0000 mg | INTRAVENOUS | Status: AC
  Administered 2018-08-12: 1000 mg via INTRAVENOUS
  Filled 2018-08-12: qty 100

## 2018-08-12 MED ORDER — CHLORHEXIDINE GLUCONATE 4 % EX LIQD: 60.0000 mL | Freq: Once | CUTANEOUS | Status: DC

## 2018-08-12 MED ORDER — METOCLOPRAMIDE HCL 5 MG PO TABS: 5.0000 mg | ORAL_TABLET | Freq: Three times a day (TID) | ORAL | Status: DC | PRN

## 2018-08-12 MED ORDER — ACETAMINOPHEN 325 MG PO TABS: 325.0000 mg | ORAL_TABLET | Freq: Four times a day (QID) | ORAL | Status: DC | PRN

## 2018-08-12 MED ORDER — ALUM & MAG HYDROXIDE-SIMETH 200-200-20 MG/5ML PO SUSP: 30.0000 mL | ORAL | Status: DC | PRN

## 2018-08-12 MED ORDER — SODIUM CHLORIDE 0.9 % IR SOLN
Status: DC | PRN
  Administered 2018-08-12: 1000 mL

## 2018-08-12 MED ORDER — GABAPENTIN 300 MG PO CAPS: 300.0000 mg | ORAL_CAPSULE | Freq: Three times a day (TID) | ORAL | 2 refills | Status: AC

## 2018-08-12 MED ORDER — TRANEXAMIC ACID-NACL 1000-0.7 MG/100ML-% IV SOLN
1000.0000 mg | Freq: Once | INTRAVENOUS | Status: AC
  Administered 2018-08-12: 1000 mg via INTRAVENOUS
  Filled 2018-08-12: qty 100

## 2018-08-12 MED ORDER — BUPIVACAINE IN DEXTROSE 0.75-8.25 % IT SOLN
INTRATHECAL | Status: DC | PRN
  Administered 2018-08-12: 1.6 mL via INTRATHECAL

## 2018-08-12 MED ORDER — ASPIRIN 81 MG PO CHEW
81.0000 mg | CHEWABLE_TABLET | Freq: Two times a day (BID) | ORAL | Status: DC
  Administered 2018-08-12 – 2018-08-13 (×2): 81 mg via ORAL
  Filled 2018-08-12 (×2): qty 1

## 2018-08-12 MED ORDER — METOCLOPRAMIDE HCL 5 MG/ML IJ SOLN: 5.0000 mg | Freq: Three times a day (TID) | INTRAMUSCULAR | Status: DC | PRN

## 2018-08-12 MED ORDER — SODIUM CHLORIDE (PF) 0.9 % IJ SOLN
INTRAMUSCULAR | Status: AC
  Filled 2018-08-12: qty 50

## 2018-08-12 MED ORDER — OXYCODONE-ACETAMINOPHEN 5-325 MG PO TABS: 1.0000 | ORAL_TABLET | ORAL | 0 refills | Status: AC | PRN

## 2018-08-12 MED ORDER — ROSUVASTATIN CALCIUM 10 MG PO TABS
10.0000 mg | ORAL_TABLET | Freq: Every day | ORAL | Status: DC
  Administered 2018-08-12 – 2018-08-13 (×2): 10 mg via ORAL
  Filled 2018-08-12 (×2): qty 1

## 2018-08-12 MED ORDER — TIZANIDINE HCL 2 MG PO TABS: 2.0000 mg | ORAL_TABLET | Freq: Four times a day (QID) | ORAL | 0 refills | Status: AC | PRN

## 2018-08-12 SURGICAL SUPPLY — 56 items
ATTUNE MED DOME PAT 41 KNEE (Knees) ×1 IMPLANT
ATTUNE MED DOME PAT 41MM KNEE (Knees) ×1 IMPLANT
ATTUNE PS FEM RT SZ 8 CEM KNEE (Femur) ×2 IMPLANT
ATTUNE PSRP INSR SZ8 5 KNEE (Insert) ×1 IMPLANT
ATTUNE PSRP INSR SZ8 5MM KNEE (Insert) ×1 IMPLANT
BAG DECANTER FOR FLEXI CONT (MISCELLANEOUS) ×3 IMPLANT
BAG SPEC THK2 15X12 ZIP CLS (MISCELLANEOUS) ×1
BAG ZIPLOCK 12X15 (MISCELLANEOUS) ×3 IMPLANT
BANDAGE ACE 6X5 VEL STRL LF (GAUZE/BANDAGES/DRESSINGS) ×3 IMPLANT
BASE TIBIAL ROT PLAT SZ 8 KNEE (Knees) IMPLANT
BLADE SAG 18X100X1.27 (BLADE) ×3 IMPLANT
BLADE SAW SGTL 11.0X1.19X90.0M (BLADE) ×3 IMPLANT
BLADE SURG SZ10 CARB STEEL (BLADE) ×6 IMPLANT
BOWL SMART MIX CTS (DISPOSABLE) ×3 IMPLANT
BSPLAT TIB 8 CMNT ROT PLAT STR (Knees) ×1 IMPLANT
CEMENT HV SMART SET (Cement) ×6 IMPLANT
COVER SURGICAL LIGHT HANDLE (MISCELLANEOUS) ×3 IMPLANT
COVER WAND RF STERILE (DRAPES) ×2 IMPLANT
CUFF TOURN SGL QUICK 34 (TOURNIQUET CUFF)
CUFF TRNQT CYL 34X4X40X1 (TOURNIQUET CUFF) ×1 IMPLANT
DECANTER SPIKE VIAL GLASS SM (MISCELLANEOUS) ×9 IMPLANT
DRAPE U-SHAPE 47X51 STRL (DRAPES) ×3 IMPLANT
DRSG AQUACEL AG ADV 3.5X10 (GAUZE/BANDAGES/DRESSINGS) ×3 IMPLANT
DURAPREP 26ML APPLICATOR (WOUND CARE) ×3 IMPLANT
ELECT REM PT RETURN 15FT ADLT (MISCELLANEOUS) ×3 IMPLANT
GLOVE BIO SURGEON STRL SZ7.5 (GLOVE) ×3 IMPLANT
GLOVE BIO SURGEON STRL SZ8.5 (GLOVE) ×3 IMPLANT
GLOVE BIOGEL PI IND STRL 8 (GLOVE) ×1 IMPLANT
GLOVE BIOGEL PI IND STRL 9 (GLOVE) ×1 IMPLANT
GLOVE BIOGEL PI INDICATOR 8 (GLOVE) ×2
GLOVE BIOGEL PI INDICATOR 9 (GLOVE) ×2
GOWN STRL REUS W/TWL XL LVL3 (GOWN DISPOSABLE) ×6 IMPLANT
HANDPIECE INTERPULSE COAX TIP (DISPOSABLE) ×3
HOOD PEEL AWAY FLYTE STAYCOOL (MISCELLANEOUS) ×9 IMPLANT
NDL HYPO 21X1.5 SAFETY (NEEDLE) ×2 IMPLANT
NEEDLE HYPO 21X1.5 SAFETY (NEEDLE) ×6 IMPLANT
NS IRRIG 1000ML POUR BTL (IV SOLUTION) ×1 IMPLANT
PACK ICE MAXI GEL EZY WRAP (MISCELLANEOUS) ×1 IMPLANT
PACK TOTAL KNEE CUSTOM (KITS) ×3 IMPLANT
PIN STEINMAN FIXATION KNEE (PIN) ×2 IMPLANT
PIN THREADED HEADED SIGMA (PIN) ×2 IMPLANT
PROTECTOR NERVE ULNAR (MISCELLANEOUS) ×1 IMPLANT
SET HNDPC FAN SPRY TIP SCT (DISPOSABLE) ×1 IMPLANT
STAPLER VISISTAT 35W (STAPLE) IMPLANT
SUT VIC AB 1 CTX 36 (SUTURE) ×3
SUT VIC AB 1 CTX36XBRD ANBCTR (SUTURE) ×1 IMPLANT
SUT VIC AB 2-0 CT1 27 (SUTURE) ×3
SUT VIC AB 2-0 CT1 TAPERPNT 27 (SUTURE) ×1 IMPLANT
SUT VIC AB 3-0 CT1 27 (SUTURE) ×3
SUT VIC AB 3-0 CT1 TAPERPNT 27 (SUTURE) ×1 IMPLANT
SYR CONTROL 10ML LL (SYRINGE) ×6 IMPLANT
TIBIAL BASE ROT PLAT SZ 8 KNEE (Knees) ×3 IMPLANT
TIP HIGH FLOW IRRIGATION COAX (MISCELLANEOUS) ×2 IMPLANT
TRAY FOLEY MTR SLVR 16FR STAT (SET/KITS/TRAYS/PACK) ×3 IMPLANT
WATER STERILE IRR 1000ML POUR (IV SOLUTION) ×6 IMPLANT
YANKAUER SUCT BULB TIP 10FT TU (MISCELLANEOUS) ×3 IMPLANT

## 2018-08-12 NOTE — Anesthesia Preprocedure Evaluation (Addendum)
Anesthesia Evaluation  Patient identified by MRN, date of birth, ID band Patient awake    Reviewed: Allergy & Precautions, NPO status , Patient's Chart, lab work & pertinent test results  Airway Mallampati: III  TM Distance: >3 FB Neck ROM: Full    Dental no notable dental hx. (+) Teeth Intact, Dental Advisory Given   Pulmonary neg pulmonary ROS, former smoker,    Pulmonary exam normal breath sounds clear to auscultation       Cardiovascular hypertension, Pt. on medications negative cardio ROS Normal cardiovascular exam Rhythm:Regular Rate:Normal     Neuro/Psych negative neurological ROS  negative psych ROS   GI/Hepatic negative GI ROS, Neg liver ROS,   Endo/Other  negative endocrine ROSdiabetes, Type 2, Oral Hypoglycemic Agents  Renal/GU negative Renal ROS  negative genitourinary   Musculoskeletal  (+) Arthritis , Osteoarthritis,    Abdominal   Peds  Hematology negative hematology ROS (+)   Anesthesia Other Findings   Reproductive/Obstetrics                            Anesthesia Physical Anesthesia Plan  ASA: II  Anesthesia Plan: Spinal and Regional   Post-op Pain Management:  Regional for Post-op pain   Induction: Intravenous  PONV Risk Score and Plan: 1 and Treatment may vary due to age or medical condition and Propofol infusion  Airway Management Planned: Natural Airway  Additional Equipment:   Intra-op Plan:   Post-operative Plan:   Informed Consent: I have reviewed the patients History and Physical, chart, labs and discussed the procedure including the risks, benefits and alternatives for the proposed anesthesia with the patient or authorized representative who has indicated his/her understanding and acceptance.   Dental advisory given  Plan Discussed with: CRNA  Anesthesia Plan Comments:         Anesthesia Quick Evaluation

## 2018-08-12 NOTE — Op Note (Signed)
PATIENT ID:      Mitchell Gomez  MRN:     [id number] DOB/AGE:    58-Feb-1962 / 58 y.o.       OPERATIVE REPORT    DATE OF PROCEDURE:  08/12/2018       PREOPERATIVE DIAGNOSIS:   RIGHT KNEE OSTEOARTHRITIS      Estimated body mass index is 33.91 kg/m as calculated from the following:   Height as of this encounter: 6' (1.829 m).   Weight as of this encounter: 113.4 kg.                                                        POSTOPERATIVE DIAGNOSIS:   RIGHT KNEE OSTEOARTHRITIS                                                                      PROCEDURE:  Procedure(s): TOTAL KNEE ARTHROPLASTY Using DepuyAttune RP implants #8R Femur, #8Tibia, 5 mm Attune RP bearing, 41 Patella     SURGEON: Mitchell Gomez    ASSISTANT:   Mitchell Gomez. Mitchell Gomez   (Present and scrubbed throughout the case, critical for assistance with exposure, retraction, instrumentation, and closure.)         ANESTHESIA: Spinal, 20cc Exparel, 50cc 0.25% Marcaine  EBL: 300 cc  FLUID REPLACEMENT: 1600 cc crystaloid  Tourniquet Time: None  Drains: None  Tranexamic Acid: 1gm IV, 2gm topical  Exparel:    COMPLICATIONS:  None         INDICATIONS FOR PROCEDURE: The patient has  RIGHT KNEE OSTEOARTHRITIS, Val deformities, XR shows bone on bone arthritis, lateral subluxation of tibia. Patient has failed all conservative measures including anti-inflammatory medicines, narcotics, attempts at exercise and weight loss, cortisone injections and viscosupplementation.  Risks and benefits of surgery have been discussed, questions answered.   DESCRIPTION OF PROCEDURE: The patient identified by armband, received  IV antibiotics, in the holding area at [facility]. Patient taken to the operating room, appropriate anesthetic monitors were attached, and Spinal anesthesia was  induced. IV Tranexamic acid was given.Tourniquet applied high to the operative thigh. Lateral post and foot positioner applied to the table, the lower extremity was  then prepped and draped in usual sterile fashion from the toes to the tourniquet. Time-out procedure was performed. The skin and subcutaneous tissue along the incision was injected with 20 cc of a mixture of Exparel and Marcaine solution, using a 20-gauge by 1-1/2 inch needle. We began the operation, with the knee flexed 130 degrees, by making the anterior midline incision starting at handbreadth above the patella going over the patella 1 cm medial to and 4 cm distal to the tibial tubercle. Small bleeders in the skin and the subcutaneous tissue identified and cauterized. Transverse retinaculum was incised and reflected medially and a medial parapatellar arthrotomy was accomplished. the patella was everted and theprepatellar fat pad resected. The superficial medial collateral ligament was then elevated from anterior to posterior along the proximal flare of the tibia and anterior half of the menisci resected. The knee was hyperflexed exposing bone on  bone arthritis. Peripheral and notch osteophytes as well as the cruciate ligaments were then resected. We continued to work our way around posteriorly along the proximal tibia, and externally rotated the tibia subluxing it out from underneath the femur. A McHale retractor was placed through the notch and a lateral Hohmann retractor placed, and we then drilled through the proximal tibia in line with the axis of the tibia followed by an intramedullary guide rod and 2-degree posterior slope cutting guide. The tibial cutting guide, 4 degree posterior sloped, was pinned into place allowing resection of 12 mm of bone medially and 0 mm of bone laterally. Satisfied with the tibial resection, we then entered the distal femur 2 mm anterior to the PCL origin with the intramedullary guide rod and applied the distal femoral cutting guide set at 9 mm, with 5 degrees of valgus. This was pinned along the epicondylar axis. At this point, the distal femoral cut was accomplished without  difficulty. We then sized for a #*R femoral component and pinned the guide in 0 degrees of external rotation. The chamfer cutting guide was pinned into place. The anterior, posterior, and chamfer cuts were accomplished without difficulty followed by the Attune RP box cutting guide and the box cut. We also removed posterior osteophytes from the posterior femoral condyles. The posterior capsule was injected with Exparel solution. The knee was brought into full extension. We checked our extension gap and fit a 5 mm bearing. Distracting in extension with a lamina spreader,  bleeders in the posterior capsule, Posterior medial and posterior lateral right down and cauterized.  The transexamic acid-soaked sponge was then placed in the gap of the knee and extension. The knee was flexed 30. The posterior patella cut was accomplished with the 9.5 mm Attune cutting guide, sized for a 41mm dome, and the fixation pegs drilled.The knee was then once again hyperflexed exposing the proximal tibia. We sized for a # 8 tibial base plate, applied the smokestack and the conical reamer followed by the the Delta fin keel punch. We then hammered into place the Attune RP trial femoral component, drilled the lugs, inserted a  5 mm trial bearing, trial patellar button, and took the knee through range of motion from 0-130 degrees. Medial and lateral ligamentous stability was checked. No thumb pressure was required for patellar Tracking. The tourniquet was not used. All trial components were removed, mating surfaces irrigated with pulse lavage, and dried with suction and sponges. 10 cc of the Exparel solution was applied to the cancellus bone of the patella distal femur and proximal tibia.  After waiting 30 seconds, the bony surfaces were again, dried with sponges. A double batch of DePuy HV cement was mixed and applied to all bony metallic mating surfaces except for the posterior condyles of the femur itself. In order, we hammered into place  the tibial tray and removed excess cement, the femoral component and removed excess cement. The final Attune RP bearing was inserted, and the knee brought to full extension with compression. The patellar button was clamped into place, and excess cement removed. The knee was held at 30 flexion with compression, while the cement cured. The wound was irrigated out with normal saline solution pulse lavage. The rest of the Exparel was injected into the parapatellar arthrotomy, subcutaneous tissues, and periosteal tissues. The parapatellar arthrotomy was closed with running #1 Vicryl suture. The subcutaneous tissue with 0 and 2-0 undyed Vicryl suture, and the skin with running 3-0 SQ vicryl. An Aquacil and Ace  wrap were applied. The patient was taken to recovery room without difficulty.   Mitchell LewandowskyFrank J Aharon Gomez 08/12/2018, 1:23 PM

## 2018-08-12 NOTE — Discharge Instructions (Signed)

## 2018-08-12 NOTE — Progress Notes (Signed)
[place] RN assisted Dr Armond HangWoodrum with right, ultrasound guided, adductor canal block. Side rails up, monitors on throughout procedure. See vital signs in flow sheet. Tolerated Procedure well.

## 2018-08-12 NOTE — Evaluation (Signed)
Physical Therapy Evaluation Patient Details Name: Mitchell CoonsDon A Gomez MRN: [id number] DOB: 11/09/1960 Today's Date: 08/12/2018   History of Present Illness  pt admit s/p R TKA 08/12/2018  Clinical Impression  Pt is s/p TKA resulting in the deficits listed below (see PT Problem List). Pt will benefit from acute PT to increase their independence and safety with mobility to allow discharge home with wife    Follow Up Recommendations Follow surgeon's recommendation for DC plan and follow-up therapies(pt states MD said HHPT )    Equipment Recommendations  Rolling walker with 5" wheels    Recommendations for Other Services       Precautions / Restrictions Precautions Precautions: Knee Precaution Comments: educated with poper position of the knee with no pillow or towel roll under the knee and to use the bone foam as much as possible.       Mobility  Bed Mobility Overal bed mobility: Needs Assistance Bed Mobility: Supine to Sit;Sit to Supine     Supine to sit: Min assist     General bed mobility comments: assist with LE and use of arms on rails   Transfers Overall transfer level: Needs assistance Equipment used: Rolling walker (2 wheeled) Transfers: Sit to/from Stand Sit to Stand: Min assist         General transfer comment: cues for RW assistance and asafety   Ambulation/Gait Ambulation/Gait assistance: Min assist Gait Distance (Feet): 3 Feet Assistive device: Rolling walker (2 wheeled)       General Gait Details: limited due to pt feelign light headed, just small steps to transfer to the recliner.   Stairs            Wheelchair Mobility    Modified Rankin (Stroke Patients Only)       Balance                                             Pertinent Vitals/Pain Pain Assessment: 0-10 Pain Score: 5  Pain Location: R knee Pain Descriptors / Indicators: Aching Pain Intervention(s): Monitored during session;Premedicated before session     Home Living Family/patient expects to be discharged to:: Private residence Living Arrangements: Spouse/significant other;Children Available Help at Discharge: Family Type of Home: House Home Access: Stairs to enter Entrance Stairs-Rails: None Entrance Stairs-Number of Steps: 2 Home Layout: Multi-level;Able to live on main level with bedroom/bathroom Home Equipment: None      Prior Function Level of Independence: Independent         Comments: works      Higher education careers adviserHand Dominance        Extremity/Trunk Assessment        Lower Extremity Assessment Lower Extremity Assessment: RLE deficits/detail RLE Deficits / Details: limited iwwth ROM due to just p/o grossly 0-50 with AAROM supine extension and flexion. unable to perform SLR independently at this time,        Communication   Communication: No difficulties  Cognition Arousal/Alertness: Awake/alert Behavior During Therapy: WFL for tasks assessed/performed Overall Cognitive Status: Within Functional Limits for tasks assessed                                        General Comments      Exercises Total Joint Exercises Ankle Circles/Pumps: AROM;Both;10 reps;Supine Quad Sets: AROM;Right;Supine;5 reps Heel Slides:  AAROM;5 reps;Right;Supine Straight Leg Raises: AAROM;5 reps;Right;Supine Goniometric ROM: 0-50   Assessment/Plan    PT Assessment Patient needs continued PT services  PT Problem List Decreased strength;Decreased range of motion;Decreased mobility       PT Treatment Interventions DME instruction;Gait training;Stair training;Functional mobility training;Therapeutic activities;Therapeutic exercise;Patient/family education    PT Goals (Current goals can be found in the Care Plan section)  Acute Rehab PT Goals Patient Stated Goal: i want to be able to go home tomorrow  PT Goal Formulation: With patient Time For Goal Achievement: 08/26/18 Potential to Achieve Goals: Good    Frequency  7X/week   Barriers to discharge        Co-evaluation               AM-PAC PT "6 Clicks" Mobility  Outcome Measure Help needed turning from your back to your side while in a flat bed without using bedrails?: A Little Help needed moving from lying on your back to sitting on the side of a flat bed without using bedrails?: A Little Help needed moving to and from a bed to a chair (including a wheelchair)?: A Little   Help needed to walk in hospital room?: A Lot Help needed climbing 3-5 steps with a railing? : A Lot 6 Click Score: 13    End of Session Equipment Utilized During Treatment: Gait belt Activity Tolerance: Patient tolerated treatment well Patient left: in chair Nurse Communication: Mobility status PT Visit Diagnosis: Other abnormalities of gait and mobility (R26.89)    Time: 1900-1930 PT Time Calculation (min) (ACUTE ONLY): 30 min   Charges:   PT Evaluation $PT Eval Low Complexity: 1 Low PT Treatments $Therapeutic Activity: 8-22 mins        Marella BileSharron Adaleen Hulgan, PT Acute Rehabilitation Services Pager: [phone] Office: [phone] 08/12/2018   Marella BileBRITT, Hilding Quintanar 08/12/2018, 7:59 PM

## 2018-08-12 NOTE — Transfer of Care (Signed)
Immediate Anesthesia Transfer of Care Note  Patient: Mitchell Gomez  Procedure(s) Performed: TOTAL KNEE ARTHROPLASTY (Right )  Patient Location: PACU  Anesthesia Type:Spinal and MAC combined with regional for post-op pain  Level of Consciousness: awake, alert  and oriented  Airway & Oxygen Therapy: Patient Spontanous Breathing and Patient connected to face mask oxygen  Post-op Assessment: Report given to RN and Post -op Vital signs reviewed and stable  Post vital signs: Reviewed and stable  Last Vitals:  Vitals Value Taken Time  BP 116/84 08/12/2018  1:48 PM  Temp    Pulse 64 08/12/2018  1:52 PM  Resp 16 08/12/2018  1:52 PM  SpO2 98 % 08/12/2018  1:52 PM  Vitals shown include unvalidated device data.  Last Pain:  Vitals:   08/12/18 1048  PainSc: 0-No pain      Patients Stated Pain Goal: 4 (50/72/25 7505)  Complications: No apparent anesthesia complications

## 2018-08-12 NOTE — Interval H&P Note (Signed)
History and Physical Interval Note:  08/12/2018 10:23 AM  Mitchell Gomez A Tkach  has presented today for surgery, with the diagnosis of RIGHT KNEE OSTEOARTHRITIS  The various methods of treatment have been discussed with the patient and family. After consideration of risks, benefits and other options for treatment, the patient has consented to  Procedure(s): TOTAL KNEE ARTHROPLASTY (Right) as a surgical intervention .  The patient's history has been reviewed, patient examined, no change in status, stable for surgery.  I have reviewed the patient's chart and labs.  Questions were answered to the patient's satisfaction.     Nestor LewandowskyFrank J Caliyah Sieh

## 2018-08-12 NOTE — Anesthesia Procedure Notes (Signed)
Spinal  Patient location during procedure: OR Start time: 08/12/2018 11:48 AM End time: 08/12/2018 11:52 AM Staffing Anesthesiologist: Elmer PickerWoodrum, Chelsey L, MD Resident/CRNA: Pearson Grippeobertson, Valentin Benney M, CRNA Performed: resident/CRNA  Preanesthetic Checklist Completed: patient identified, site marked, surgical consent, pre-op evaluation, timeout performed, IV checked, risks and benefits discussed and monitors and equipment checked Spinal Block Patient position: sitting Prep: DuraPrep Patient monitoring: heart rate, cardiac monitor, continuous pulse ox and blood pressure Approach: midline Location: L3-4 Injection technique: single-shot Needle Needle type: Pencan  Needle gauge: 24 G Needle length: 10 cm Assessment Sensory level: T6 Additional Notes Spinal attempt x1, + clear, free-flowing CSF, easy to inject, - paresthesia, level to T6, NAC. Expiration date of spinal tray verified.

## 2018-08-12 NOTE — Anesthesia Postprocedure Evaluation (Signed)
Anesthesia Post Note  Patient: Mitchell Gomez  Procedure(s) Performed: TOTAL KNEE ARTHROPLASTY (Right )     Patient location during evaluation: PACU Anesthesia Type: Regional and Spinal Level of consciousness: oriented and awake and alert Pain management: pain level controlled Vital Signs Assessment: post-procedure vital signs reviewed and stable Respiratory status: spontaneous breathing, respiratory function stable and patient connected to nasal cannula oxygen Cardiovascular status: blood pressure returned to baseline and stable Postop Assessment: no headache, no backache and no apparent nausea or vomiting Anesthetic complications: no    Last Vitals:  Vitals:   08/12/18 1613 08/12/18 1724  BP: 137/84 (!) 145/92  Pulse: 65 84  Resp: 14 16  Temp: 36.6 C 36.6 C  SpO2: 100% 100%    Last Pain:  Vitals:   08/12/18 1724  TempSrc: Oral  PainSc:                  Chelsey L Woodrum

## 2018-08-12 NOTE — Anesthesia Procedure Notes (Addendum)
   Anesthesia Regional Block: Adductor canal block   Pre-Anesthetic Checklist: ,, timeout performed, Correct Patient, Correct Site, Correct Laterality, Correct Procedure, Correct Position, site marked, Risks and benefits discussed,  Surgical consent,  Pre-op evaluation,  At surgeon's request and post-op pain management  Laterality: Right  Prep: Maximum Sterile Barrier Precautions used, chloraprep       Needles:  Injection technique: Single-shot  Needle Type: Echogenic Stimulator Needle     Needle Length: 9cm  Needle Gauge: 22     Additional Needles:   Procedures:,,,, ultrasound used (permanent image in chart),,,,  Narrative:  Start time: 08/12/2018 10:46 AM End time: 08/12/2018 10:56 AM Injection made incrementally with aspirations every 5 mL.  Performed by: Personally  Anesthesiologist: Elmer PickerWoodrum, Afreen Siebels L, MD  Additional Notes: Monitors applied. No increased pain on injection. No increased resistance to injection. Injection made in 5cc increments. Good needle visualization. Patient tolerated procedure well.

## 2018-08-13 DIAGNOSIS — M1711 Unilateral primary osteoarthritis, right knee: Secondary | ICD-10-CM | POA: Diagnosis not present

## 2018-08-13 LAB — BASIC METABOLIC PANEL
Anion gap: 8 (ref 5–15)
BUN: 12 mg/dL (ref 6–20)
CO2: 26 mmol/L (ref 22–32)
Calcium: 8.3 mg/dL — ABNORMAL LOW (ref 8.9–10.3)
Chloride: 100 mmol/L (ref 98–111)
Creatinine, Ser: 0.91 mg/dL (ref 0.61–1.24)
GFR calc Af Amer: >60 mL/min (ref >60)
GFR calc non Af Amer: >60 mL/min (ref >60)
Glucose, Bld: 183 mg/dL — ABNORMAL HIGH (ref 70–99)
Potassium: 4.1 mmol/L (ref 3.5–5.1)
Sodium: 134 mmol/L — ABNORMAL LOW (ref 135–145)

## 2018-08-13 LAB — CBC
HEMATOCRIT: 38.8 % — AB (ref 39.0–52.0)
Hemoglobin: 12.5 g/dL — ABNORMAL LOW (ref 13.0–17.0)
MCH: 28.3 pg (ref 26.0–34.0)
MCHC: 32.2 g/dL (ref 30.0–36.0)
MCV: 87.8 fL (ref 80.0–100.0)
Platelets: 242 10*3/uL (ref 150–400)
RBC: 4.42 MIL/uL (ref 4.22–5.81)
RDW: 12.4 % (ref 11.5–15.5)
WBC: 12.5 10*3/uL — AB (ref 4.0–10.5)
nRBC: 0 % (ref 0.0–0.2)

## 2018-08-13 LAB — GLUCOSE, CAPILLARY
Glucose-Capillary: 186 mg/dL — ABNORMAL HIGH (ref 70–99)
Glucose-Capillary: 169 mg/dL — ABNORMAL HIGH (ref 70–99)

## 2018-08-13 NOTE — Plan of Care (Signed)
Pt is stable. Plan of care reviewed . Pain management in progress, effective. 

## 2018-08-13 NOTE — Discharge Summary (Signed)
Patient ID: Mitchell Gomez MRN: [id number] DOB/AGE: 58/04/1961 58 y.o.  Admit date: 08/12/2018 Discharge date: 08/13/2018  Admission Diagnoses:  Principal Problem:   Osteoarthritis of right knee Active Problems:   S/P TKR (total knee replacement), right   Discharge Diagnoses:  Same  Past Medical History:  Diagnosis Date  . Diabetes mellitus without complication (HCC)   . High cholesterol   . Hypertension   . Psoriasis     Surgeries: Procedure(s): TOTAL KNEE ARTHROPLASTY on 08/12/2018   Consultants:   Discharged Condition: Improved  Hospital Course: Mitchell CoonsDon A Jorstad is an 57 y.o. male who was admitted 08/12/2018 for operative treatment ofOsteoarthritis of right knee. Patient has severe unremitting pain that affects sleep, daily activities, and work/hobbies. After pre-op clearance the patient was taken to the operating room on 08/12/2018 and underwent  Procedure(s): TOTAL KNEE ARTHROPLASTY.    Patient was given perioperative antibiotics:  Anti-infectives (From admission, onward)   Start     Dose/Rate Route Frequency Ordered Stop   08/12/18 0845  ceFAZolin (ANCEF) IVPB 2g/100 mL premix     2 g 200 mL/hr over 30 Minutes Intravenous On call to O.R. 08/12/18 [id number] 08/12/18 1227       Patient was given sequential compression devices, early ambulation, and chemoprophylaxis to prevent DVT.  Patient benefited maximally from hospital stay and there were no complications.    Recent vital signs:  Patient Vitals for the past 24 hrs:  BP Temp Temp src Pulse Resp SpO2  08/13/18 0537 119/75 98.2 F (36.8 C) Oral 91 16 93 %  08/13/18 0118 120/76 98.2 F (36.8 C) Oral 90 17 96 %  08/12/18 2133 (!) 142/94 98.4 F (36.9 C) Oral (!) 101 17 96 %  08/12/18 1815 (!) 150/79 97.8 F (36.6 C) Oral 95 16 100 %  08/12/18 1724 (!) 145/92 97.8 F (36.6 C) Oral 84 16 100 %  08/12/18 1613 137/84 97.9 F (36.6 C) Oral 65 14 100 %  08/12/18 1600 140/89 - - 63 16 100 %  08/12/18 1545 (!)  179/93 97.8 F (36.6 C) - 71 15 100 %  08/12/18 1530 (!) 130/95 - - (!) 51 13 100 %  08/12/18 1526 (!) 80/62 - - (!) 41 17 100 %  08/12/18 1515 124/88 - - 62 18 100 %  08/12/18 1500 126/89 - - (!) 58 17 100 %  08/12/18 1445 (!) 119/94 97.7 F (36.5 C) - 61 14 100 %  08/12/18 1430 135/89 - - (!) 58 14 100 %  08/12/18 1415 120/90 - - 61 13 100 %  08/12/18 1400 117/80 - - 64 16 100 %  08/12/18 1348 116/84 97.6 F (36.4 C) - 74 16 96 %  08/12/18 1118 (!) 142/95 - - 75 10 98 %  08/12/18 1112 (!) 141/87 - - 74 - 97 %  08/12/18 1107 (!) 127/99 - - 78 18 98 %  08/12/18 1102 (!) 142/88 - - 77 18 97 %  08/12/18 1059 - - - - 14 -  08/12/18 1058 (!) 136/92 - - 81 - 98 %  08/12/18 1053 (!) 140/92 - - 81 19 97 %  08/12/18 1048 (!) 154/86 - - 80 14 99 %  08/12/18 1047 - - - 82 12 98 %  08/12/18 1046 - - - 80 - 98 %  08/12/18 1045 - - - 82 10 100 %  08/12/18 1044 - - - 74 17 98 %  08/12/18 1043 (!) 147/99 - - 80 14  100 %     Recent laboratory studies:  Recent Labs    08/13/18 0525  WBC 12.5*  HGB 12.5*  HCT 38.8*  PLT 242  NA 134*  K 4.1  CL 100  CO2 26  BUN 12  CREATININE 0.91  GLUCOSE 183*  CALCIUM 8.3*     Discharge Medications:   Allergies as of 08/13/2018   No Known Allergies     Medication List    STOP taking these medications   meloxicam 15 MG tablet Commonly known as:  MOBIC     TAKE these medications   aspirin EC 81 MG tablet Take 1 tablet (81 mg total) by mouth 2 (two) times daily.   fluocinonide 0.05 % external solution Commonly known as:  LIDEX Apply 1 application topically daily as needed (irritation).   gabapentin 300 MG capsule Commonly known as:  NEURONTIN Take 1 capsule (300 mg total) by mouth 3 (three) times daily.   metFORMIN 500 MG 24 hr tablet Commonly known as:  GLUCOPHAGE-XR Take 1,000 mg by mouth 2 (two) times daily.   olmesartan 20 MG tablet Commonly known as:  BENICAR Take 20 mg by mouth daily.   oxyCODONE-acetaminophen 5-325 MG  tablet Commonly known as:  PERCOCET/ROXICET Take 1 tablet by mouth every 4 (four) hours as needed for severe pain.   rosuvastatin 10 MG tablet Commonly known as:  CRESTOR Take 10 mg by mouth daily.   tiZANidine 2 MG tablet Commonly known as:  ZANAFLEX Take 1 tablet (2 mg total) by mouth every 6 (six) hours as needed.   TREMFYA 100 MG/ML Sopn Generic drug:  Guselkumab Inject 100 mg into the skin every 8 (eight) weeks.            Durable Medical Equipment  (From admission, onward)         Start     Ordered   08/12/18 1611  DME Walker rolling  Once    Question:  Patient needs a walker to treat with the following condition  Answer:  Status post right knee replacement   08/12/18 1610   08/12/18 1611  DME 3 n 1  Once     08/12/18 1610           Discharge Care Instructions  (From admission, onward)         Start     Ordered   08/13/18 0000  Weight bearing as tolerated     08/13/18 0901          Diagnostic Studies: Dg Chest 2 View  Result Date: 08/08/2018 CLINICAL DATA:  Preop for total knee arthroplasty. EXAM: CHEST - 2 VIEW COMPARISON:  None. FINDINGS: Normal heart size. Lungs are under aerated and clear. No pneumothorax. No pleural effusion. IMPRESSION: No active cardiopulmonary disease. Electronically Signed   By: Jolaine Click M.D.   On: 08/08/2018 11:31    Disposition: Discharge disposition: 01-Home or Self Care       Discharge Instructions    Call MD / Call 911   Complete by:  As directed    If you experience chest pain or shortness of breath, CALL 911 and be transported to the hospital emergency room.  If you develope a fever above 101 F, pus (white drainage) or increased drainage or redness at the wound, or calf pain, call your surgeon's office.   Constipation Prevention   Complete by:  As directed    Drink plenty of fluids.  Prune juice may be helpful.  You may  use a stool softener, such as Colace (over the counter) 100 mg twice a day.  Use  MiraLax (over the counter) for constipation as needed.   Diet - low sodium heart healthy   Complete by:  As directed    Driving restrictions   Complete by:  As directed    No driving for 2 weeks   Increase activity slowly as tolerated   Complete by:  As directed    Patient may shower   Complete by:  As directed    You may shower without a dressing once there is no drainage.  Do not wash over the wound.  If drainage remains, cover wound with plastic wrap and then shower.   Weight bearing as tolerated   Complete by:  As directed       Follow-up Information    Gean Birchwoodowan, Frank, MD In 2 weeks.   Specialty:  Orthopedic Surgery Contact information: [address] [place] [address] [phone]            Signed: Dannielle Burnric Phillips 08/13/2018, 9:02 AM

## 2018-08-13 NOTE — Progress Notes (Signed)
Physical Therapy Treatment Patient Details Name: Mitchell CoonsDon A Izquierdo MRN: [id number] DOB: 07/04/1961 Today's Date: 08/13/2018    History of Present Illness pt admit s/p R TKA 08/12/2018    PT Comments    Progressing with mobility. Will plan to have a 2nd session prior to d/c later today. Plan is for d/c home with possibly HHPT-pt was unsure.   Follow Up Recommendations  Follow surgeon's recommendation for DC plan and follow-up therapies     Equipment Recommendations       Recommendations for Other Services       Precautions / Restrictions Precautions Precautions: Knee Restrictions Weight Bearing Restrictions: No Other Position/Activity Restrictions: WBAT    Mobility  Bed Mobility Overal bed mobility: Needs Assistance Bed Mobility: Supine to Sit     Supine to sit: Min guard;HOB elevated     General bed mobility comments: Close guard for safety. Increased time. Pt relied on bedrail.  Transfers Overall transfer level: Needs assistance Equipment used: Rolling walker (2 wheeled) Transfers: Sit to/from Stand Sit to Stand: Min guard;From elevated surface         General transfer comment: Close guard for safety. VCs safety, hand/LE placement.   Ambulation/Gait Ambulation/Gait assistance: Min guard Gait Distance (Feet): 110 Feet Assistive device: Rolling walker (2 wheeled) Gait Pattern/deviations: Step-to pattern     General Gait Details: Close guard for safety. VCs safety, sequence. Slow gait speed. Pt denied dizziness.    Stairs             Wheelchair Mobility    Modified Rankin (Stroke Patients Only)       Balance Overall balance assessment: Needs assistance         Standing balance support: Bilateral upper extremity supported Standing balance-Leahy Scale: Poor                              Cognition Arousal/Alertness: Awake/alert Behavior During Therapy: WFL for tasks assessed/performed Overall Cognitive Status: Within  Functional Limits for tasks assessed                                        Exercises Total Joint Exercises Ankle Circles/Pumps: AROM;Both;10 reps;Supine Quad Sets: AROM;Both;10 reps;Supine Heel Slides: AAROM;Right;10 reps;Supine Hip ABduction/ADduction: AAROM;Right;10 reps;Supine Straight Leg Raises: AAROM;Right;10 reps;Supine Goniometric ROM: ~10-60 degrees    General Comments        Pertinent Vitals/Pain Pain Assessment: 0-10 Pain Score: 5  Pain Location: R knee Pain Descriptors / Indicators: Aching;Sore Pain Intervention(s): Monitored during session;Repositioned;Ice applied    Home Living                      Prior Function            PT Goals (current goals can now be found in the care plan section) Progress towards PT goals: Progressing toward goals    Frequency    7X/week      PT Plan Current plan remains appropriate    Co-evaluation              AM-PAC PT "6 Clicks" Mobility   Outcome Measure  Help needed turning from your back to your side while in a flat bed without using bedrails?: A Little Help needed moving from lying on your back to sitting on the side of a flat bed without using bedrails?: A Little  Help needed moving to and from a bed to a chair (including a wheelchair)?: A Little Help needed standing up from a chair using your arms (e.g., wheelchair or bedside chair)?: A Little Help needed to walk in hospital room?: A Little Help needed climbing 3-5 steps with a railing? : A Lot 6 Click Score: 17    End of Session Equipment Utilized During Treatment: Gait belt Activity Tolerance: Patient tolerated treatment well Patient left: in chair;with call bell/phone within reach   PT Visit Diagnosis: Other abnormalities of gait and mobility (R26.89);Pain;Unsteadiness on feet (R26.81) Pain - Right/Left: Right Pain - part of body: Knee     Time: [id number] PT Time Calculation (min) (ACUTE ONLY): 23 min  Charges:   $Gait Training: 8-22 mins $Therapeutic Exercise: 8-22 mins                        Rebeca AlertJannie Krystin Keeven, PT Acute Rehabilitation Services Pager: [phone] Office: [phone]

## 2018-08-13 NOTE — Progress Notes (Signed)
PATIENT ID: Mitchell Gomez  MRN: [id number]  DOB/AGE:  58/26/1962 / 57 y.o.  1 Day Post-Op Procedure(s) (LRB): TOTAL KNEE ARTHROPLASTY (Right)    PROGRESS NOTE Subjective: Patient is alert, oriented, no Nausea, no Vomiting, yes passing gas. Taking PO well. Denies SOB, Chest or Calf Pain. Using Incentive Spirometer, PAS in place. Ambulate WBAT with pt walking 3 ft with pt the day of surgery, Patient reports pain as 3/10 .    Objective: Vital signs in last 24 hours: Vitals:   08/12/18 1815 08/12/18 2133 08/13/18 0118 08/13/18 0537  BP: (!) 150/79 (!) 142/94 120/76 119/75  Pulse: 95 (!) 101 90 91  Resp: 16 17 17 16   Temp: 97.8 F (36.6 C) 98.4 F (36.9 C) 98.2 F (36.8 C) 98.2 F (36.8 C)  TempSrc: Oral Oral Oral Oral  SpO2: 100% 96% 96% 93%  Weight:      Height:          Intake/Output from previous day: I/O last 3 completed shifts: In: 3514.9 [P.O.:200; I.V.:3189.9; IV Piggyback:125] Out: 1575 [Urine:1375; Blood:200]   Intake/Output this shift: No intake/output data recorded.   LABORATORY DATA: Recent Labs    08/12/18 1717 08/12/18 2130 08/13/18 0525 08/13/18 0847  WBC  --   --  12.5*  --   HGB  --   --  12.5*  --   HCT  --   --  38.8*  --   PLT  --   --  242  --   NA  --   --  134*  --   K  --   --  4.1  --   CL  --   --  100  --   CO2  --   --  26  --   BUN  --   --  12  --   CREATININE  --   --  0.91  --   GLUCOSE  --   --  183*  --   GLUCAP 120* 177*  --  186*  CALCIUM  --   --  8.3*  --     Examination: Neurologically intact Neurovascular intact Sensation intact distally Intact pulses distally Dorsiflexion/Plantar flexion intact Incision: dressing C/D/I and no drainage No cellulitis present Compartment soft}  Assessment:   1 Day Post-Op Procedure(s) (LRB): TOTAL KNEE ARTHROPLASTY (Right) ADDITIONAL DIAGNOSIS: Expected Acute Blood Loss Anemia, Diabetes and Hypertension  Plan: PT/OT WBAT, AROM and PROM  DVT Prophylaxis:  SCDx72hrs, ASA 81 mg  BID x 2 weeks DISCHARGE PLAN: Home, when pt passes therapy goals DISCHARGE NEEDS: HHPT, Walker and 3-in-1 comode seat     Dannielle Burnric Janari Yamada 08/13/2018, 8:59 AM

## 2018-08-13 NOTE — Progress Notes (Addendum)
Physical Therapy Treatment Patient Details Name: Mitchell Gomez MRN: [id number] DOB: 11/01/1960 Today's Date: 08/13/2018    History of Present Illness 6058 to male s/p R TKA 08/12/2018    PT Comments    Progressing with mobility. Reviewed/practiced gait and stair training. Issued HEP for pt to perform 2x/day until he begins HHPT. All education completed. Okay to d/c from PT standpoint-made RN aware.    Follow Up Recommendations  Follow surgeon's recommendation for DC plan and follow-up therapies     Equipment Recommendations  Rolling walker with 5" wheels    Recommendations for Other Services       Precautions / Restrictions Precautions Precautions: Knee Restrictions Weight Bearing Restrictions: No Other Position/Activity Restrictions: WBAT    Mobility  Bed Mobility Overal bed mobility: Needs Assistance Bed Mobility: Supine to Sit          General bed mobility comments: oob in recliner  Transfers Overall transfer level: Needs assistance Equipment used: Rolling walker (2 wheeled) Transfers: Sit to/from Stand Sit to Stand: Min guard         General transfer comment: Close guard for safety. VCs safety, hand/LE placement.   Ambulation/Gait Ambulation/Gait assistance: Min guard Gait Distance (Feet): 75 Feet Assistive device: Rolling walker (2 wheeled) Gait Pattern/deviations: Step-to pattern     General Gait Details: Close guard for safety. VCs safety, sequence. Slow gait speed. Pt denied dizziness.    Stairs  Min Assist Practiced stair negotiation x 2. Once forwards with RW, then once backwards with RW. Pt prefers to use the forwards technique.            Wheelchair Mobility    Modified Rankin (Stroke Patients Only)       Balance Overall balance assessment: Needs assistance         Standing balance support: Bilateral upper extremity supported Standing balance-Leahy Scale: Poor                              Cognition  Arousal/Alertness: Awake/alert Behavior During Therapy: WFL for tasks assessed/performed Overall Cognitive Status: Within Functional Limits for tasks assessed                                        Exercises    General Comments        Pertinent Vitals/Pain Pain Assessment: 0-10 Pain Score: 7  Pain Location: R knee Pain Descriptors / Indicators: Aching;Sore Pain Intervention(s): Monitored during session;Repositioned;Ice applied    Home Living                      Prior Function            PT Goals (current goals can now be found in the care plan section) Progress towards PT goals: Progressing toward goals    Frequency    7X/week      PT Plan Current plan remains appropriate    Co-evaluation              AM-PAC PT "6 Clicks" Mobility   Outcome Measure  Help needed turning from your back to your side while in a flat bed without using bedrails?: A Little Help needed moving from lying on your back to sitting on the side of a flat bed without using bedrails?: A Little Help needed moving to and from a bed to  a chair (including a wheelchair)?: A Little Help needed standing up from a chair using your arms (e.g., wheelchair or bedside chair)?: A Little Help needed to walk in hospital room?: A Little Help needed climbing 3-5 steps with a railing? : A Little 6 Click Score: 18    End of Session Equipment Utilized During Treatment: Gait belt Activity Tolerance: Patient tolerated treatment well Patient left: in chair;with call bell/phone within reach   PT Visit Diagnosis: Other abnormalities of gait and mobility (R26.89);Pain;Unsteadiness on feet (R26.81) Pain - Right/Left: Right Pain - part of body: Knee     Time: 1436-1500 PT Time Calculation (min) (ACUTE ONLY): 24 min  Charges:  $Gait Training: 23-37 mins $Therapeutic Exercise: 8-22 mins                        Rebeca AlertJannie Mitchell Gomez, PT Acute Rehabilitation Services Pager:  [phone] Office: [phone]

## 2018-08-13 NOTE — Care Management Note (Signed)
Case Management Note  Patient Details  Name: Mitchell Gomez MRN: [id number] Date of Birth: 01/15/1961  Subjective/Objective:                    Action/Plan[facility] Pt states she will not need any DME.    Expected Discharge Date:  08/13/18               Expected Discharge Plan:  Home w Home Health Services  In-House Referral:     Discharge planning Services  CM Consult  Post Acute Care Choice:    Choice offered to:  Patient  DME Arranged:    DME Agency:     HH Arranged:  PT HH [facility]  Status of Service:  Completed, signed off  If discussed at Long Length of Stay Meetings, dates discussed:    Additional CommentsGeni Bers:  Mitchell Voorhees, RN 08/13/2018, 12:03 PM

## 2018-08-15 ENCOUNTER — Encounter (HOSPITAL_COMMUNITY): Payer: Self-pay | Admitting: Orthopedic Surgery

## 2019-10-12 IMAGING — CR DG CHEST 2V
2 series · 2 of 2 positions shown · non-contrast
Comparison: None.

CLINICAL DATA: Preop for total knee arthroplasty.

EXAM:
CHEST - 2 VIEW

[w chest pa]
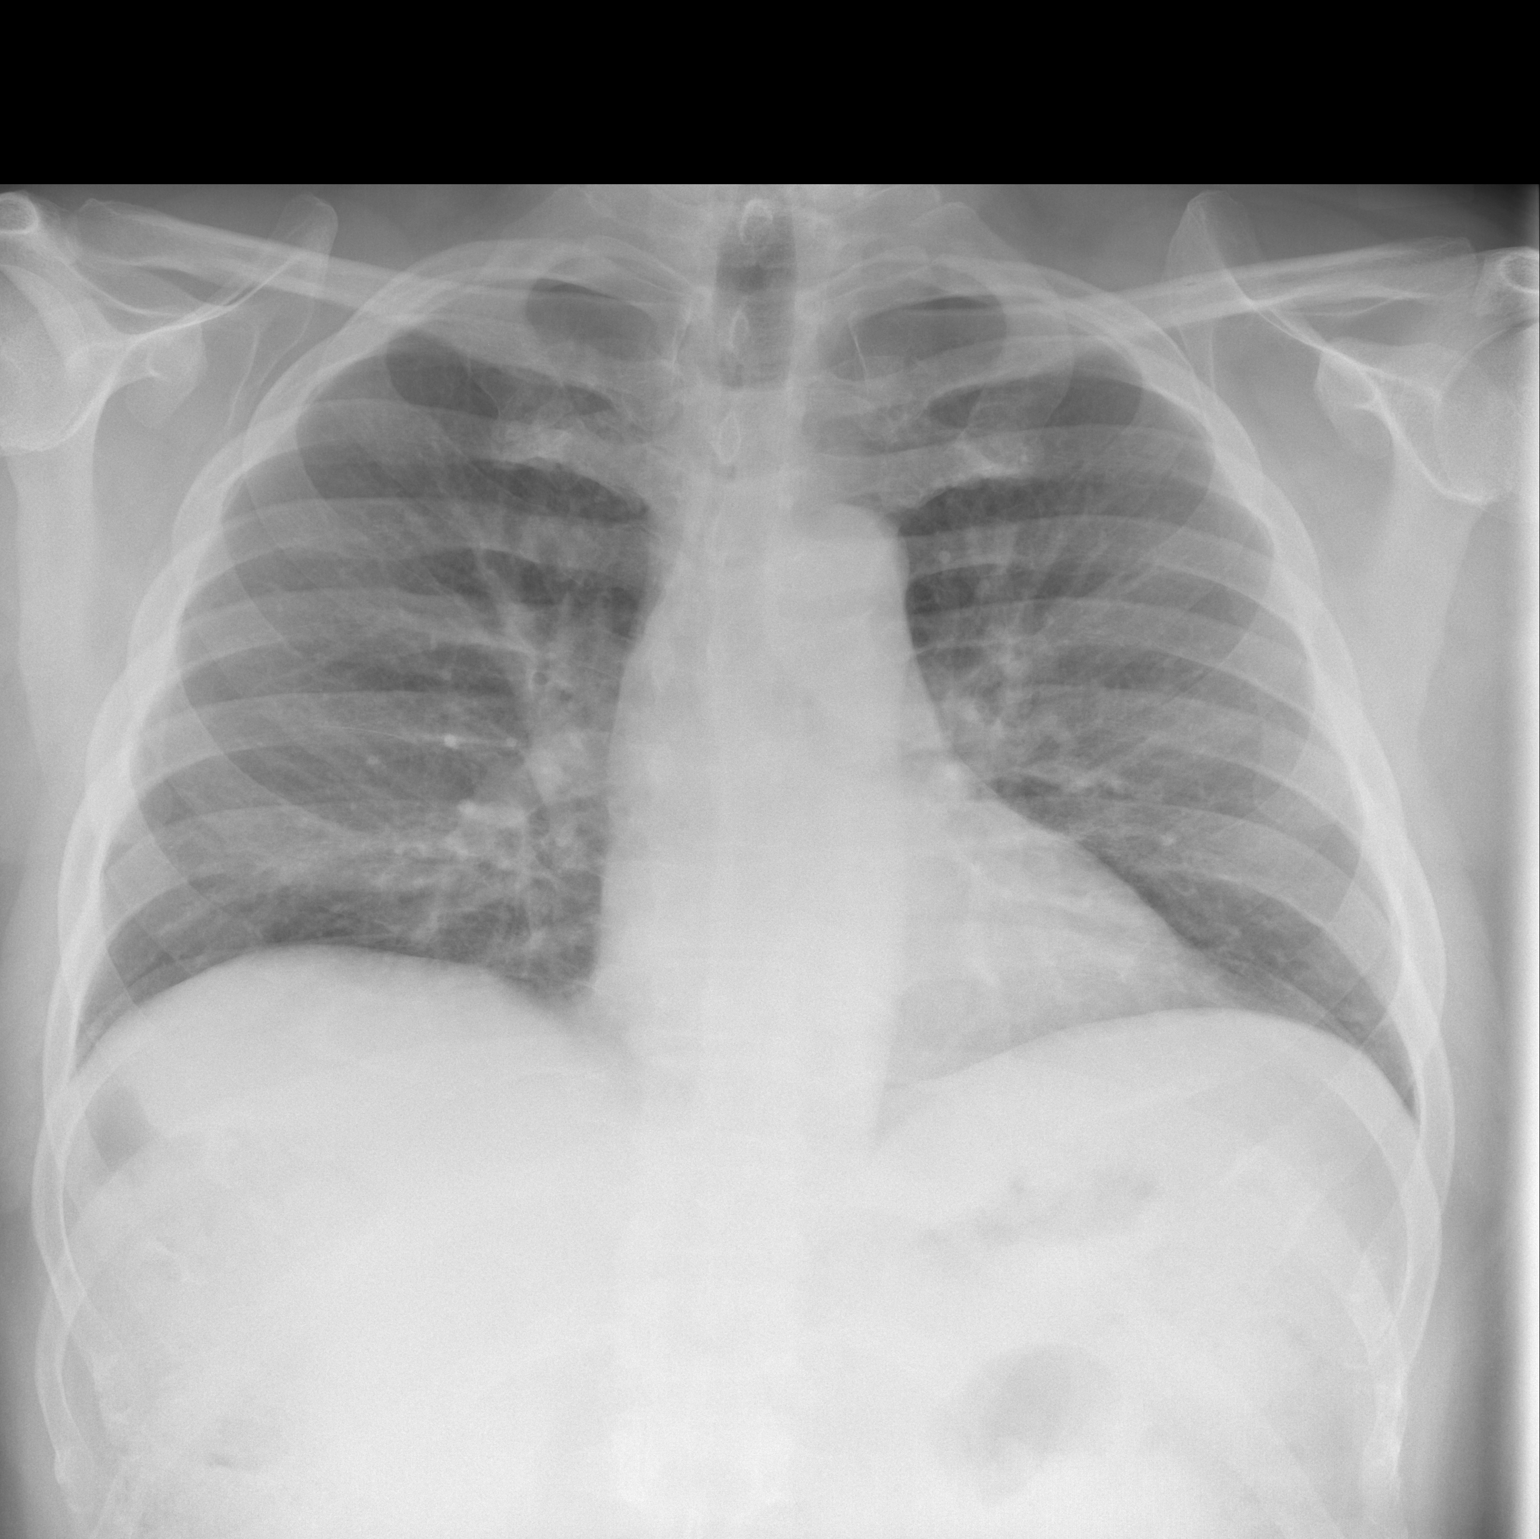

[w chest lat]
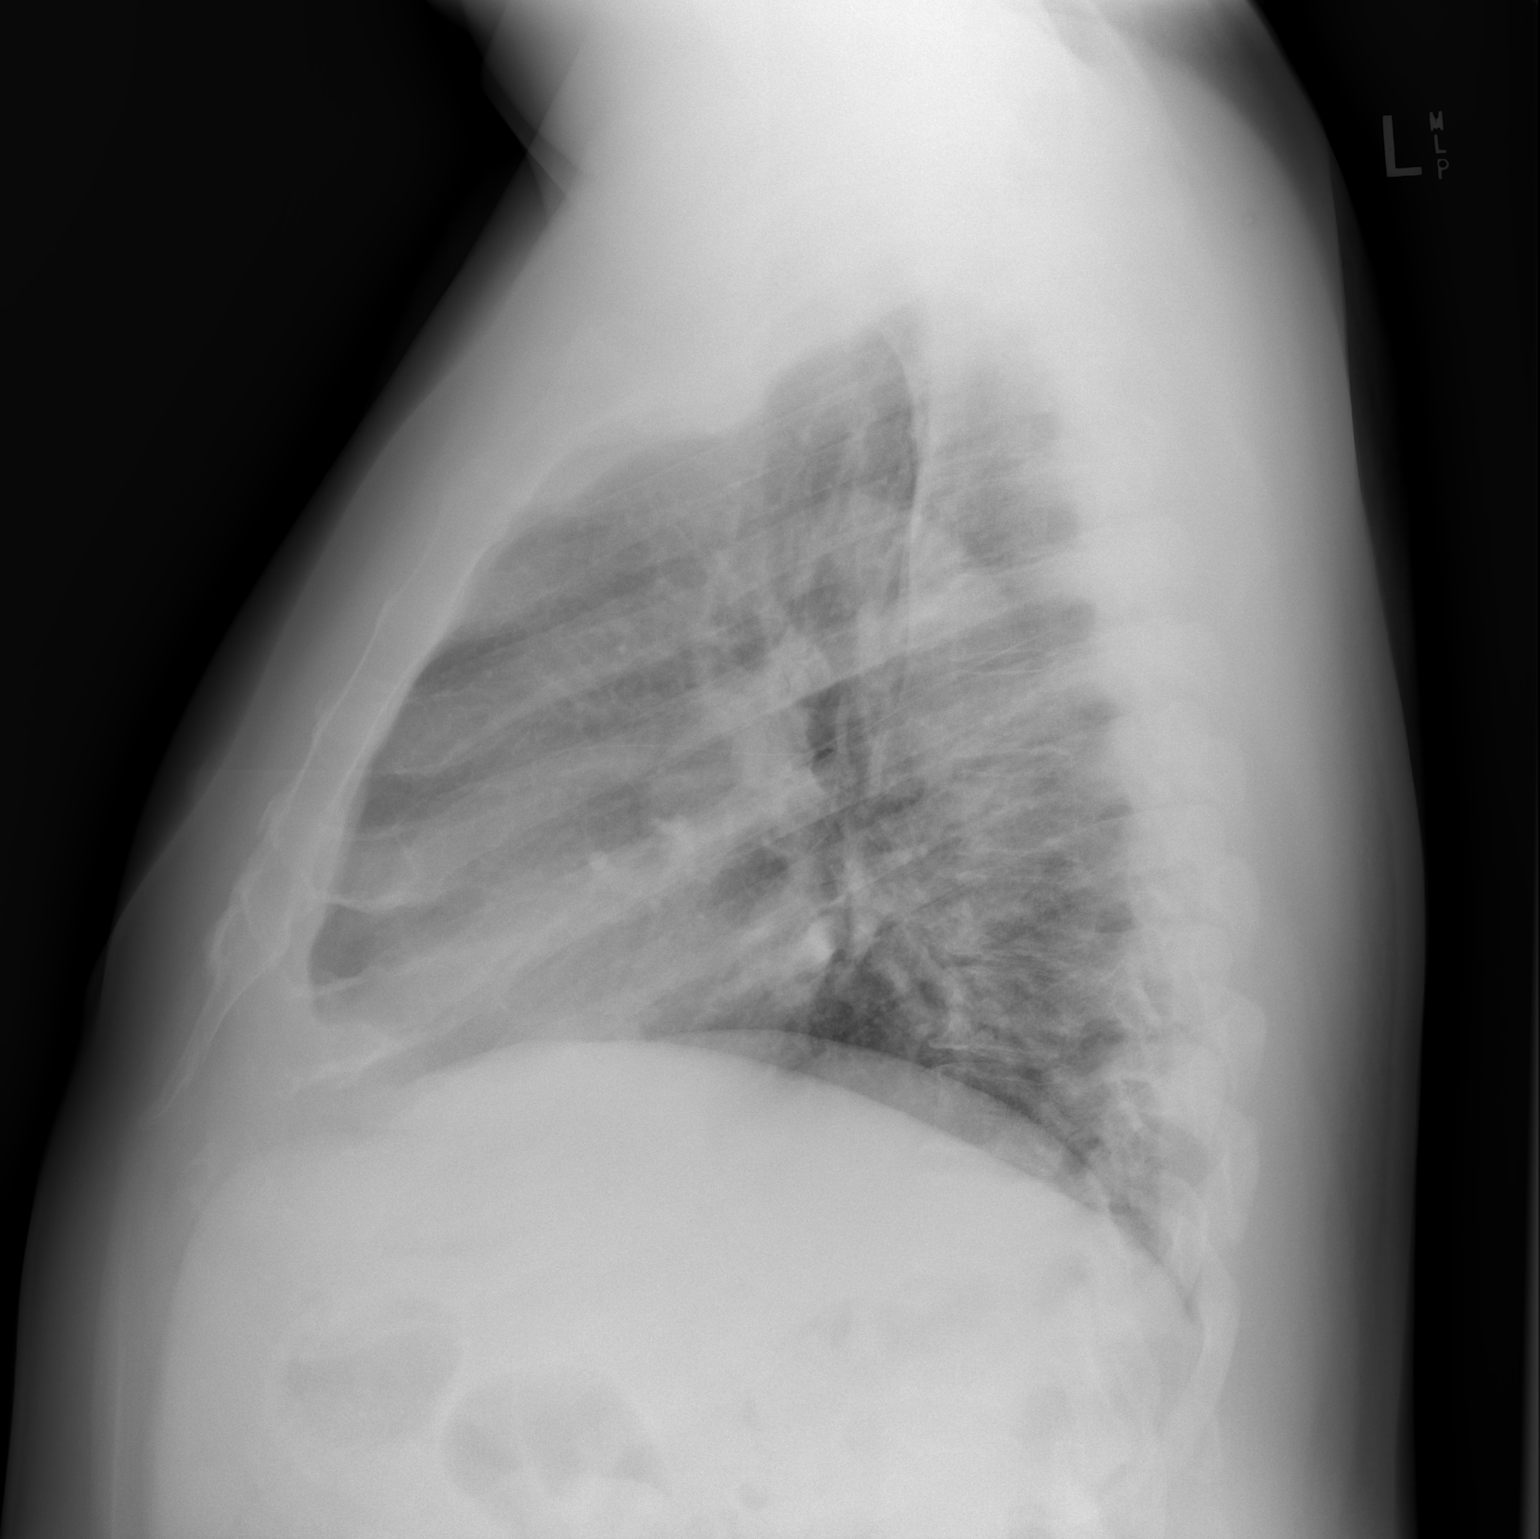

[2 of 2 positions shown; findings below may reference images not displayed]

FINDINGS: Normal heart size. Lungs are under aerated and clear. No
pneumothorax. No pleural effusion.
IMPRESSION: No active cardiopulmonary disease.
